# Patient Record
Sex: Female | Born: 1955 | Race: White | Hispanic: No | Marital: Married | Smoking: Never smoker
Health system: Southern US, Community
[De-identification: ages and names within clinical notes are randomized; demographics above are authoritative.]

## PROBLEM LIST (undated history)

## (undated) DIAGNOSIS — I10 Essential (primary) hypertension: Secondary | ICD-10-CM

## (undated) DIAGNOSIS — E039 Hypothyroidism, unspecified: Secondary | ICD-10-CM

## (undated) DIAGNOSIS — E0500 Thyrotoxicosis with diffuse goiter without thyrotoxic crisis or storm: Secondary | ICD-10-CM

## (undated) HISTORY — DX: Essential (primary) hypertension: I10

## (undated) HISTORY — DX: Hypothyroidism, unspecified: E03.9

## (undated) HISTORY — DX: Thyrotoxicosis with diffuse goiter without thyrotoxic crisis or storm: E05.00

---

## 2005-01-12 ENCOUNTER — Ambulatory Visit: Payer: Self-pay

## 2006-01-18 ENCOUNTER — Ambulatory Visit: Payer: Self-pay

## 2007-01-08 ENCOUNTER — Ambulatory Visit: Payer: Self-pay

## 2007-01-24 ENCOUNTER — Ambulatory Visit: Payer: Self-pay

## 2008-02-03 ENCOUNTER — Ambulatory Visit: Payer: Self-pay

## 2009-02-18 ENCOUNTER — Ambulatory Visit: Payer: Self-pay

## 2010-02-24 ENCOUNTER — Ambulatory Visit: Payer: Self-pay

## 2010-03-27 ENCOUNTER — Ambulatory Visit: Payer: Self-pay | Admitting: Internal Medicine

## 2010-11-12 ENCOUNTER — Ambulatory Visit: Payer: Self-pay | Admitting: Internal Medicine

## 2011-03-02 ENCOUNTER — Ambulatory Visit: Payer: Self-pay

## 2011-03-09 ENCOUNTER — Ambulatory Visit: Payer: Self-pay

## 2011-03-18 ENCOUNTER — Ambulatory Visit: Payer: Self-pay | Admitting: Internal Medicine

## 2012-01-18 ENCOUNTER — Ambulatory Visit: Payer: Self-pay | Admitting: Family Medicine

## 2012-03-20 ENCOUNTER — Ambulatory Visit: Payer: Self-pay

## 2013-03-25 ENCOUNTER — Ambulatory Visit: Payer: Self-pay

## 2013-04-08 LAB — HM COLONOSCOPY

## 2014-01-14 LAB — LIPID PANEL
Cholesterol: 161 mg/dL (ref 0–200)
HDL: 34 mg/dL — AB (ref 35–70)
LDL Cholesterol: 84 mg/dL
LDl/HDL Ratio: 2.5
Triglycerides: 215 mg/dL — AB (ref 40–160)

## 2014-01-14 LAB — HEPATIC FUNCTION PANEL
ALT: 22 U/L (ref 7–35)
AST: 23 U/L (ref 13–35)
Alkaline Phosphatase: 94 U/L (ref 25–125)
Bilirubin, Total: 0.3 mg/dL

## 2014-01-14 LAB — CBC AND DIFFERENTIAL
HCT: 41 % (ref 36–46)
Hemoglobin: 14.2 g/dL (ref 12.0–16.0)
Neutrophils Absolute: 50 /uL
Platelets: 284 10*3/uL (ref 150–399)

## 2014-01-14 LAB — BASIC METABOLIC PANEL
BUN: 9 mg/dL (ref 4–21)
Creatinine: 0.7 mg/dL (ref <=1.1)
Glucose: 93 mg/dL
Potassium: 4.5 mmol/L (ref 3.4–5.3)
Sodium: 138 mmol/L (ref 137–147)

## 2014-01-14 LAB — TSH: TSH: 0.51 u[IU]/mL (ref <=5.90)

## 2014-01-17 LAB — CBC AND DIFFERENTIAL: WBC: 5.6 10^3/mL

## 2014-03-30 ENCOUNTER — Ambulatory Visit: Payer: Self-pay

## 2014-11-08 DIAGNOSIS — F329 Major depressive disorder, single episode, unspecified: Secondary | ICD-10-CM | POA: Insufficient documentation

## 2014-11-08 DIAGNOSIS — E039 Hypothyroidism, unspecified: Secondary | ICD-10-CM | POA: Insufficient documentation

## 2014-11-08 DIAGNOSIS — E0500 Thyrotoxicosis with diffuse goiter without thyrotoxic crisis or storm: Secondary | ICD-10-CM | POA: Insufficient documentation

## 2015-01-15 ENCOUNTER — Other Ambulatory Visit: Payer: Self-pay | Admitting: Family Medicine

## 2015-01-26 ENCOUNTER — Encounter: Payer: Self-pay | Admitting: Family Medicine

## 2015-01-26 ENCOUNTER — Ambulatory Visit (INDEPENDENT_AMBULATORY_CARE_PROVIDER_SITE_OTHER): Payer: BC Managed Care – PPO | Admitting: Family Medicine

## 2015-01-26 VITALS — BP 120/78 | HR 64 | Temp 97.9°F | Resp 16 | Ht 61.0 in | Wt 126.0 lb

## 2015-01-26 DIAGNOSIS — M7660 Achilles tendinitis, unspecified leg: Secondary | ICD-10-CM | POA: Diagnosis not present

## 2015-01-26 DIAGNOSIS — Z0000 Encounter for general adult medical examination without abnormal findings: Secondary | ICD-10-CM | POA: Diagnosis not present

## 2015-01-26 LAB — POCT URINALYSIS DIPSTICK
Bilirubin, UA: NEGATIVE
Blood, UA: NEGATIVE
Glucose, UA: NEGATIVE
Ketones, UA: NEGATIVE
Leukocytes, UA: NEGATIVE
Nitrite, UA: NEGATIVE
Protein, UA: NEGATIVE
Spec Grav, UA: 1.025
Urobilinogen, UA: 0.2
pH, UA: 6

## 2015-01-26 NOTE — Progress Notes (Signed)
Patient ID: Tina Marshall, female   DOB: 03/18/1956, 59 y.o.   MRN: [id number]       Patient: Tina Marshall, Female    DOB: 1955/12/13, 59 y.o.   MRN: [id number] Visit Date: 01/26/2015  Today's Provider: Megan Mans, MD   Chief Complaint  Patient presents with  . Annual Exam   Subjective:    Annual physical exam Tina Marshall is a 59 y.o. female who presents today for health maintenance and complete physical. She feels well. She reports exercising every day. She reports she is sleeping well.  ----------------------------------------------------------------- Pap and mammo with Dr. Luella Cook Colonscopy per pt Dr. Servando Snare but they dont have records EKG- 01/04/12 Tdap 01/04/12 BMD- 01/18/12 Normal Osteopenia left dual femur Zoster 12/07/10   Review of Systems  Constitutional: Negative.   HENT: Negative.   Eyes: Negative.   Respiratory: Negative.   Cardiovascular: Negative.   Gastrointestinal: Negative.   Endocrine: Negative.   Genitourinary: Negative.   Musculoskeletal:       She noticed pain at the heel of the right foot after going to the beach and walking in the sand for a week. Some mornings it is worse. Bothers her when she wears sandals or flip-flops or goes barefoot.  Skin: Negative.   Allergic/Immunologic: Negative.   Neurological: Negative.   Hematological: Negative.   Psychiatric/Behavioral: Negative.   All other systems reviewed and are negative.   Social History She  reports that she has never smoked. She has never used smokeless tobacco. She reports that she does not drink alcohol or use illicit drugs.  Patient Active Problem List   Diagnosis Date Noted  . 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 Affective disorder, major 11/08/2014    History reviewed. No pertinent past surgical history.  Family History  Family Status  Relation Status Death Age  . Mother Alive   . Father Alive   . Sister Alive   . Brother Alive   .  Daughter Alive   . Maternal Grandmother Deceased   . Maternal Grandfather Deceased   . Paternal Grandmother Deceased   . Paternal Grandfather Deceased     Her family history includes Anemia in her sister; Atrial fibrillation in her mother; Breast cancer in her maternal grandmother and paternal grandmother; Dementia in her father; Heart attack in her maternal grandfather and paternal grandfather; Heart disease in her father; Hypertension in her brother and sister; Parkinson's disease in her father.    No Known Allergies  Previous Medications   ASPIRIN 81 MG EC TABLET    Take by mouth.   CRANBERRY FRUIT 475 MG CAPS    Take by mouth.   LEVOTHYROXINE (SYNTHROID, LEVOTHROID) 88 MCG TABLET    TAKE ONE TABLET BY MOUTH ONCE DAILY   MULTIPLE VITAMIN PO    Take by mouth.   PROBIOTIC PRODUCT (PROBIOTIC ADVANCED PO)    Take by mouth.    Patient Care Team: Maple Hudson., MD as PCP - General (Family Medicine)     Objective:   Vitals: BP 120/78 mmHg  Pulse 64  Temp(Src) 97.9 F (36.6 C) (Oral)  Resp 16  Ht 5\' 1"  (1.549 m)  Wt 126 lb (57.153 kg)  BMI 23.82 kg/m2   Physical Exam  Constitutional: She is oriented to person, place, and time. She appears well-developed and well-nourished.  HENT:  Head: Normocephalic and atraumatic.  Right Ear: External ear normal.  Left Ear: External ear normal.  Nose: Nose normal.  Mouth/Throat: Oropharynx is clear and moist.  Eyes: EOM are normal. Pupils are equal, round, and reactive to light.  Neck: Normal range of motion. Neck supple.  Cardiovascular: Normal rate, regular rhythm, normal heart sounds and intact distal pulses.   Pulmonary/Chest: Effort normal and breath sounds normal.  Abdominal: Soft. Bowel sounds are normal.  Genitourinary:  deferred to GYN  Musculoskeletal: Normal range of motion.  Neurological: She is alert and oriented to person, place, and time. She has normal reflexes.  Skin: Skin is warm and dry.  Deeply tanned but  no skin lesions noted.  Psychiatric: She has a normal mood and affect. Her behavior is normal. Judgment and thought content normal.     Depression Screen No flowsheet data found.    Assessment & Plan:     Routine Health Maintenance and Physical Exam  Exercise Activities and Dietary recommendations Goals    None      Immunization History  Administered Date(s) Administered  . Tdap 01/04/2012  . Zoster 12/07/2010    Health Maintenance  Topic Date Due  . Hepatitis C Screening  1955/10/10  . HIV Screening  07/16/1971  . PAP SMEAR  07/15/1974  . MAMMOGRAM  07/15/2006  . COLONOSCOPY  07/15/2006  . INFLUENZA VACCINE  01/11/2015  . TETANUS/TDAP  01/03/2022      Discussed health benefits of physical activity, and encouraged her to engage in regular exercise appropriate for her age and condition.    --------------------------------------------------------------------  1. Annual physical exam  - CBC with Differential/Platelet - COMPLETE METABOLIC PANEL WITH GFR - Lipid Panel With LDL/HDL Ratio - TSH - POCT urinalysis dipstick  2. Achilles tendinitis, right She will wear her tennis shoes through September. Call if no better. Also [place] Aleve once a  day for pain as needed. I have done the exam and reviewed the above chart and it is accurate to the best of my knowledge.  Patient was seen and examined by Dr. Julieanne Manson, and noted scribed by Dimas Chyle, CMA

## 2015-01-27 LAB — COMPREHENSIVE METABOLIC PANEL
ALT: 18 IU/L (ref 0–32)
AST: 20 IU/L (ref 0–40)
Albumin/Globulin Ratio: 2 (ref 1.1–2.5)
Albumin: 4.5 g/dL (ref 3.5–5.5)
Alkaline Phosphatase: 78 IU/L (ref 39–117)
BUN/Creatinine Ratio: 19 (ref 9–23)
BUN: 13 mg/dL (ref 6–24)
Bilirubin Total: 0.5 mg/dL (ref 0.0–1.2)
CO2: 23 mmol/L (ref 18–29)
Calcium: 9.4 mg/dL (ref 8.7–10.2)
Chloride: 105 mmol/L (ref 97–108)
Creatinine, Ser: 0.68 mg/dL (ref 0.57–1.00)
GFR calc Af Amer: 112 mL/min/{1.73_m2} (ref >59)
GFR calc non Af Amer: 97 mL/min/{1.73_m2} (ref >59)
Globulin, Total: 2.2 g/dL (ref 1.5–4.5)
Glucose: 89 mg/dL (ref 65–99)
Potassium: 4.5 mmol/L (ref 3.5–5.2)
Sodium: 144 mmol/L (ref 134–144)
Total Protein: 6.7 g/dL (ref 6.0–8.5)

## 2015-01-27 LAB — CBC WITH DIFFERENTIAL/PLATELET
Basophils Absolute: 0 10*3/uL (ref 0.0–0.2)
Basos: 1 %
EOS (ABSOLUTE): 0.2 10*3/uL (ref 0.0–0.4)
Eos: 5 %
Hematocrit: 38.2 % (ref 34.0–46.6)
Hemoglobin: 13.3 g/dL (ref 11.1–15.9)
Immature Grans (Abs): 0 10*3/uL (ref 0.0–0.1)
Immature Granulocytes: 0 %
Lymphocytes Absolute: 1.9 10*3/uL (ref 0.7–3.1)
Lymphs: 37 %
MCH: 33.4 pg — ABNORMAL HIGH (ref 26.6–33.0)
MCHC: 34.8 g/dL (ref 31.5–35.7)
MCV: 96 fL (ref 79–97)
Monocytes Absolute: 0.4 10*3/uL (ref 0.1–0.9)
Monocytes: 8 %
Neutrophils Absolute: 2.7 10*3/uL (ref 1.4–7.0)
Neutrophils: 49 %
Platelets: 251 10*3/uL (ref 150–379)
RBC: 3.98 x10E6/uL (ref 3.77–5.28)
RDW: 13.5 % (ref 12.3–15.4)
WBC: 5.3 10*3/uL (ref 3.4–10.8)

## 2015-01-27 LAB — TSH: TSH: 0.715 u[IU]/mL (ref 0.450–4.500)

## 2015-01-27 LAB — LIPID PANEL WITH LDL/HDL RATIO
Cholesterol, Total: 151 mg/dL (ref 100–199)
HDL: 39 mg/dL — ABNORMAL LOW (ref >39)
LDL Calculated: 84 mg/dL (ref 0–99)
LDl/HDL Ratio: 2.2 ratio units (ref 0.0–3.2)
Triglycerides: 142 mg/dL (ref 0–149)
VLDL Cholesterol Cal: 28 mg/dL (ref 5–40)

## 2015-01-28 ENCOUNTER — Other Ambulatory Visit: Payer: Self-pay | Admitting: Emergency Medicine

## 2015-01-28 DIAGNOSIS — E039 Hypothyroidism, unspecified: Secondary | ICD-10-CM

## 2015-01-28 MED ORDER — LEVOTHYROXINE SODIUM 88 MCG PO TABS: 88.0000 ug | ORAL_TABLET | Freq: Every day | ORAL | Status: DC

## 2015-02-02 LAB — HM PAP SMEAR

## 2015-03-09 ENCOUNTER — Other Ambulatory Visit: Payer: Self-pay | Admitting: Unknown Physician Specialty

## 2015-03-09 DIAGNOSIS — Z1231 Encounter for screening mammogram for malignant neoplasm of breast: Secondary | ICD-10-CM

## 2015-04-06 ENCOUNTER — Ambulatory Visit: Payer: Self-pay

## 2015-04-07 ENCOUNTER — Ambulatory Visit
Admission: RE | Admit: 2015-04-07 | Discharge: 2015-04-07 | Disposition: A | Discharge status: Home / Self Care | Payer: BC Managed Care – PPO | Source: Ambulatory Visit | Attending: Unknown Physician Specialty | Admitting: Unknown Physician Specialty

## 2015-04-07 DIAGNOSIS — Z1231 Encounter for screening mammogram for malignant neoplasm of breast: Secondary | ICD-10-CM | POA: Insufficient documentation

## 2015-04-14 ENCOUNTER — Other Ambulatory Visit: Payer: Self-pay | Admitting: Family Medicine

## 2015-04-14 DIAGNOSIS — E039 Hypothyroidism, unspecified: Secondary | ICD-10-CM

## 2015-04-14 MED ORDER — LEVOTHYROXINE SODIUM 88 MCG PO TABS: 88.0000 ug | ORAL_TABLET | Freq: Every day | ORAL | Status: DC

## 2015-04-14 NOTE — Telephone Encounter (Signed)
Yes, done-aa

## 2015-04-14 NOTE — Telephone Encounter (Signed)
Last TSH was checked on 01/26/15 and was stable. Ok to refill medication? Please advise. Thanks!

## 2015-04-14 NOTE — Telephone Encounter (Signed)
Pt needs 90 d refill on levothyroxine (SYNTHROID, LEVOTHROID) 88 MCG tablet 01/28/15 --   [facility] [address]  Call back is [phone]  Thanks Barth Kirkseri  -

## 2015-04-20 ENCOUNTER — Telehealth: Payer: Self-pay | Admitting: Family Medicine

## 2015-04-20 ENCOUNTER — Other Ambulatory Visit: Payer: Self-pay

## 2015-04-20 DIAGNOSIS — E039 Hypothyroidism, unspecified: Secondary | ICD-10-CM

## 2015-04-20 MED ORDER — LEVOTHYROXINE SODIUM 88 MCG PO TABS: 88.0000 ug | ORAL_TABLET | Freq: Every day | ORAL | Status: DC

## 2015-04-20 NOTE — Telephone Encounter (Signed)
Pt contacted office for refill request on the following medications:  Pt is requesting the Rx for levothyroxine (SYNTHROID, LEVOTHROID) 88 MCG resent for a 90 day supply.  [facility] [address]  CB#[phone]/MW

## 2016-01-18 NOTE — Telephone Encounter (Signed)
error 

## 2016-01-27 ENCOUNTER — Encounter: Payer: Self-pay | Admitting: Family Medicine

## 2016-01-27 ENCOUNTER — Ambulatory Visit (INDEPENDENT_AMBULATORY_CARE_PROVIDER_SITE_OTHER): Payer: BC Managed Care – PPO | Admitting: Family Medicine

## 2016-01-27 VITALS — BP 124/70 | HR 76 | Temp 97.8°F | Resp 14 | Ht 61.0 in | Wt 127.0 lb

## 2016-01-27 DIAGNOSIS — E039 Hypothyroidism, unspecified: Secondary | ICD-10-CM | POA: Diagnosis not present

## 2016-01-27 DIAGNOSIS — Z1283 Encounter for screening for malignant neoplasm of skin: Secondary | ICD-10-CM | POA: Diagnosis not present

## 2016-01-27 DIAGNOSIS — Z0000 Encounter for general adult medical examination without abnormal findings: Secondary | ICD-10-CM | POA: Diagnosis not present

## 2016-01-27 LAB — POCT URINALYSIS DIPSTICK
Bilirubin, UA: NEGATIVE
Blood, UA: NEGATIVE
Glucose, UA: NEGATIVE
Ketones, UA: NEGATIVE
Leukocytes, UA: NEGATIVE
Nitrite, UA: NEGATIVE
Protein, UA: NEGATIVE
Spec Grav, UA: 1.01
Urobilinogen, UA: 0.2
pH, UA: 6.5

## 2016-01-27 MED ORDER — LEVOTHYROXINE SODIUM 88 MCG PO TABS: 88.0000 ug | ORAL_TABLET | Freq: Every day | ORAL | 3 refills | Status: DC

## 2016-01-27 MED ORDER — LEVOTHYROXINE SODIUM 88 MCG PO TABS: 88.0000 ug | ORAL_TABLET | Freq: Every day | ORAL | 12 refills | Status: DC

## 2016-01-27 NOTE — Progress Notes (Signed)
Patient: Tina AltLaurie K Sumida, Female    DOB: 11/22/1955, 60 y.o.   MRN: [id number] Visit Date: 01/27/2016  Today's Provider: Megan Mansichard Gilbert Jr, MD   Chief Complaint  Patient presents with  . Annual Exam   Subjective:    Annual physical exam Tina Marshall is a 60 y.o. female who presents today for health maintenance and complete physical. She feels well. She reports exercising 4-5 times a week. She reports she is sleeping well. 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 She feels great. ----------------------------------------------------------------- Colonoscopy-03/12/13 normal repeat 10 years. Pap- 01/2015- Has an appointment next week for this with [facility].  Mammo- 04/07/15 Normal- Dr. Luella Cookosenow  Immunization History  Administered Date(s) Administered  . Influenza,inj,Quad PF,36+ Mos 03/12/2015  . Tdap 01/04/2012  . Zoster 12/07/2010     Review of Systems  Constitutional: Negative.   HENT: Negative.   Eyes: Negative.   Respiratory: Negative.   Cardiovascular: Negative.   Gastrointestinal: Negative.   Endocrine: Negative.   Genitourinary: Negative.   Musculoskeletal: Negative.   Skin: Negative.   Allergic/Immunologic: Negative.   Neurological: Negative.   Hematological: Negative.   Psychiatric/Behavioral: Negative.     Social History      She  reports that she has never smoked. She has never used smokeless tobacco. She reports that she does not drink alcohol or use drugs.       Social History   Social History  . Marital status: Married    Spouse name: N/A  . Number of children: N/A  . Years of education: N/A   Social History Main Topics  . Smoking status: Never Smoker  . Smokeless tobacco: Never Used  . Alcohol use No  . Drug use: No  . Sexual activity: Not Asked   Other Topics Concern  . None   Social History Narrative  . None    History reviewed. No pertinent past medical  history.   Patient Active Problem List   Diagnosis Date Noted  . 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 Affective disorder, major (HCC) 11/08/2014    History reviewed. No pertinent surgical history.  Family History        Family Status  Relation Status  . Mother Alive  . Father Alive  . Sister Alive  . Brother Alive  . Maternal Grandmother Deceased  . Maternal Grandfather Deceased  . Paternal Grandmother Deceased  . Paternal Grandfather Deceased  . Daughter Alive  . Paternal Aunt         Her family history includes Anemia in her sister; Atrial fibrillation in her mother; Breast cancer in her paternal grandmother; Breast cancer (age of onset: 6738) in her paternal aunt; Breast cancer (age of onset: 4270) in her maternal grandmother; Dementia in her father; Heart attack in her maternal grandfather and paternal grandfather; Heart disease in her father; Hypertension in her brother and sister; Parkinson's disease in her father.    No Known Allergies  Current Meds  Medication Sig  . aspirin 81 MG EC tablet Take by mouth.  . Cranberry Fruit 475 MG CAPS Take by mouth.  . levothyroxine (SYNTHROID, LEVOTHROID) 88 MCG tablet Take 1 tablet (88 mcg total) by mouth daily.  . MULTIPLE VITAMIN PO Take by mouth.  . Probiotic Product (PROBIOTIC ADVANCED PO) Take by mouth.    Patient Care Team: Maple Hudsonichard L Gilbert Jr., MD as PCP - General (Family Medicine)  Objective:   Vitals: BP 124/70 (BP Location: Left Arm, Patient Position: Sitting, Cuff Size: Normal)   Pulse 76   Temp 97.8 F (36.6 C) (Oral)   Resp 14   Ht 5\' 1"  (1.549 m)   Wt 127 lb (57.6 kg)   BMI 24.00 kg/m    Physical Exam  Constitutional: She is oriented to person, place, and time. She appears well-developed and well-nourished.  HENT:  Head: Normocephalic and atraumatic.  Right Ear: External ear normal.  Left Ear: External ear normal.  Nose: Nose normal.  Mouth/Throat: Oropharynx is clear  and moist.  Eyes: Conjunctivae and EOM are normal. Pupils are equal, round, and reactive to light.  Neck: Normal range of motion. Neck supple.  Cardiovascular: Normal rate, regular rhythm, normal heart sounds and intact distal pulses.   Pulmonary/Chest: Effort normal and breath sounds normal.  Abdominal: Soft. Bowel sounds are normal.  Genitourinary:  Genitourinary Comments: GYN exam deferred to gynecology.  Musculoskeletal: Normal range of motion.  Neurological: She is alert and oriented to person, place, and time. She has normal reflexes.  Skin: Skin is warm and dry.  Patient has very tanned skin but no obvious lesions.  Psychiatric: She has a normal mood and affect. Her behavior is normal. Judgment and thought content normal.     Depression Screen PHQ 2/9 Scores 01/27/2016  PHQ - 2 Score 0      Assessment & Plan:     Routine Health Maintenance and Physical Exam  Exercise Activities and Dietary recommendations Goals    None      Immunization History  Administered Date(s) Administered  . Influenza,inj,Quad PF,36+ Mos 03/12/2015  . Tdap 01/04/2012  . Zoster 12/07/2010    Health Maintenance  Topic Date Due  . Hepatitis C Screening  1955/11/01  . HIV Screening  07/16/1971  . PAP SMEAR  07/15/1977  . COLONOSCOPY  07/15/2006  . INFLUENZA VACCINE  01/11/2016  . MAMMOGRAM  04/06/2017  . TETANUS/TDAP  01/03/2022      Discussed health benefits of physical activity, and encouraged her to engage in regular exercise appropriate for her age and condition.    --------------------------------------------------------------------  1. Annual physical exam Overall health is good. Gynecology exam per her OB/GYN next week. - CBC with Differential/Platelet - Lipid Panel With LDL/HDL Ratio - TSH - POCT urinalysis dipstick - Comprehensive metabolic panel - Ambulatory referral to Dermatology She is artery had shingles vaccine. 2. Skin cancer screening  - Ambulatory referral  to Dermatology  3. Hypothyroidism, unspecified hypothyroidism type  - TSH - levothyroxine (SYNTHROID, LEVOTHROID) 88 MCG tablet; Take 1 tablet (88 mcg total) by mouth daily.  Dispense: 30 tablet; Refill: 12 4. Mild dyshidrotic eczema of both hands Refer to dermatology.  Richard Wendelyn BreslowGilbert Jr, MD  [facility]

## 2016-01-28 LAB — CBC WITH DIFFERENTIAL/PLATELET
Basophils Absolute: 0.1 10*3/uL (ref 0.0–0.2)
Basos: 1 %
EOS (ABSOLUTE): 0.3 10*3/uL (ref 0.0–0.4)
Eos: 6 %
Hematocrit: 39 % (ref 34.0–46.6)
Hemoglobin: 13.4 g/dL (ref 11.1–15.9)
Immature Grans (Abs): 0 10*3/uL (ref 0.0–0.1)
Immature Granulocytes: 0 %
Lymphocytes Absolute: 1.8 10*3/uL (ref 0.7–3.1)
Lymphs: 39 %
MCH: 33.2 pg — ABNORMAL HIGH (ref 26.6–33.0)
MCHC: 34.4 g/dL (ref 31.5–35.7)
MCV: 97 fL (ref 79–97)
Monocytes Absolute: 0.4 10*3/uL (ref 0.1–0.9)
Monocytes: 8 %
Neutrophils Absolute: 2.2 10*3/uL (ref 1.4–7.0)
Neutrophils: 46 %
Platelets: 251 10*3/uL (ref 150–379)
RBC: 4.04 x10E6/uL (ref 3.77–5.28)
RDW: 13.8 % (ref 12.3–15.4)
WBC: 4.7 10*3/uL (ref 3.4–10.8)

## 2016-01-28 LAB — COMPREHENSIVE METABOLIC PANEL
ALT: 37 IU/L — ABNORMAL HIGH (ref 0–32)
AST: 31 IU/L (ref 0–40)
Albumin/Globulin Ratio: 1.8 (ref 1.2–2.2)
Albumin: 4.5 g/dL (ref 3.5–5.5)
Alkaline Phosphatase: 74 IU/L (ref 39–117)
BUN/Creatinine Ratio: 23 (ref 9–23)
BUN: 13 mg/dL (ref 6–24)
Bilirubin Total: 0.4 mg/dL (ref 0.0–1.2)
CO2: 23 mmol/L (ref 18–29)
Calcium: 9.3 mg/dL (ref 8.7–10.2)
Chloride: 103 mmol/L (ref 96–106)
Creatinine, Ser: 0.56 mg/dL — ABNORMAL LOW (ref 0.57–1.00)
GFR calc Af Amer: 118 mL/min/{1.73_m2} (ref >59)
GFR calc non Af Amer: 102 mL/min/{1.73_m2} (ref >59)
Globulin, Total: 2.5 g/dL (ref 1.5–4.5)
Glucose: 98 mg/dL (ref 65–99)
Potassium: 4.5 mmol/L (ref 3.5–5.2)
Sodium: 141 mmol/L (ref 134–144)
Total Protein: 7 g/dL (ref 6.0–8.5)

## 2016-01-28 LAB — LIPID PANEL WITH LDL/HDL RATIO
Cholesterol, Total: 167 mg/dL (ref 100–199)
HDL: 48 mg/dL (ref >39)
LDL Calculated: 103 mg/dL — ABNORMAL HIGH (ref 0–99)
LDl/HDL Ratio: 2.1 ratio units (ref 0.0–3.2)
Triglycerides: 79 mg/dL (ref 0–149)
VLDL Cholesterol Cal: 16 mg/dL (ref 5–40)

## 2016-01-28 LAB — TSH: TSH: 1.31 u[IU]/mL (ref 0.450–4.500)

## 2016-04-10 ENCOUNTER — Other Ambulatory Visit: Payer: Self-pay | Admitting: Family Medicine

## 2016-04-10 ENCOUNTER — Other Ambulatory Visit: Payer: Self-pay | Admitting: Obstetrics and Gynecology

## 2016-04-10 DIAGNOSIS — Z1231 Encounter for screening mammogram for malignant neoplasm of breast: Secondary | ICD-10-CM

## 2016-05-17 ENCOUNTER — Ambulatory Visit
Admission: RE | Admit: 2016-05-17 | Discharge: 2016-05-17 | Disposition: A | Discharge status: Home / Self Care | Payer: BC Managed Care – PPO | Source: Ambulatory Visit | Attending: Obstetrics and Gynecology | Admitting: Obstetrics and Gynecology

## 2016-05-17 DIAGNOSIS — Z1231 Encounter for screening mammogram for malignant neoplasm of breast: Secondary | ICD-10-CM | POA: Diagnosis not present

## 2017-01-24 ENCOUNTER — Other Ambulatory Visit: Payer: Self-pay | Admitting: Family Medicine

## 2017-01-24 DIAGNOSIS — E039 Hypothyroidism, unspecified: Secondary | ICD-10-CM

## 2017-01-30 ENCOUNTER — Encounter: Payer: Self-pay | Admitting: Family Medicine

## 2017-01-30 ENCOUNTER — Ambulatory Visit (INDEPENDENT_AMBULATORY_CARE_PROVIDER_SITE_OTHER): Payer: BC Managed Care – PPO | Admitting: Family Medicine

## 2017-01-30 VITALS — BP 122/70 | HR 72 | Temp 97.8°F | Resp 16 | Ht 61.0 in | Wt 131.0 lb

## 2017-01-30 DIAGNOSIS — Z780 Asymptomatic menopausal state: Secondary | ICD-10-CM

## 2017-01-30 DIAGNOSIS — M85859 Other specified disorders of bone density and structure, unspecified thigh: Secondary | ICD-10-CM | POA: Diagnosis not present

## 2017-01-30 DIAGNOSIS — Z0000 Encounter for general adult medical examination without abnormal findings: Secondary | ICD-10-CM

## 2017-01-30 LAB — POCT URINALYSIS DIPSTICK
Bilirubin, UA: NEGATIVE
Blood, UA: NEGATIVE
Glucose, UA: NEGATIVE
Ketones, UA: NEGATIVE
Leukocytes, UA: NEGATIVE
Nitrite, UA: NEGATIVE
Protein, UA: NEGATIVE
Spec Grav, UA: 1.015 (ref 1.010–1.025)
Urobilinogen, UA: 0.2 E.U./dL
pH, UA: 6.5 (ref 5.0–8.0)

## 2017-01-30 NOTE — Progress Notes (Signed)
Patient: Tina Marshall, Female    DOB: 05-Nov-1955, 61 y.o.   MRN: [id number] Visit Date: 01/30/2017  Today's Provider: Megan Mans, MD   Chief Complaint  Patient presents with  . Annual Exam   Subjective:  Tina Marshall is a 61 y.o. female who presents today for health maintenance and complete physical. She feels well. She reports exercising 4 times week. She reports she is sleeping well.  Immunization History  Administered Date(s) Administered  . Influenza,inj,Quad PF,6+ Mos 03/12/2015  . Influenza,inj,quad, With Preservative 03/24/2016  . Tdap 01/04/2012  . Zoster 12/07/2010   03/12/13 Colonoscopy-normal 01/18/12 BMD-Osteopenia 05/18/16 Mammogram-normal   Review of Systems  Constitutional: Negative.   HENT: Negative.   Eyes: Negative.   Respiratory: Negative.   Cardiovascular: Negative.   Gastrointestinal: Negative.   Endocrine: Negative.   Genitourinary: Negative.   Musculoskeletal: Negative.   Skin: Negative.   Allergic/Immunologic: Negative.   Neurological: Negative.   Hematological: Negative.   Psychiatric/Behavioral: Negative.     Social History   Social History  . Marital status: Married    Spouse name: N/A  . Number of children: N/A  . Years of education: N/A   Occupational History  . Not on file.   Social History Main Topics  . Smoking status: Never Smoker  . Smokeless tobacco: Never Used  . Alcohol use No  . Drug use: No  . Sexual activity: Not on file   Other Topics Concern  . Not on file   Social History Narrative  . No narrative on file    Patient Active Problem List   Diagnosis Date Noted  . 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 Affective disorder, major 11/08/2014    History reviewed. No pertinent surgical history.  Her family history includes Anemia in her sister; Atrial fibrillation in her mother; Breast cancer in her paternal grandmother; Breast cancer (age of onset: 83) in her paternal aunt;  Breast cancer (age of onset: 65) in her maternal grandmother; Dementia in her father; Heart attack in her maternal grandfather and paternal grandfather; Heart disease in her father; Hypertension in her brother and sister; Parkinson's disease in her father.     Outpatient Encounter Prescriptions as of 01/30/2017  Medication Sig Note  . aspirin 81 MG EC tablet Take by mouth. 11/08/2014: Received from: [facility]  . Cranberry Fruit 475 MG CAPS Take by mouth. 11/08/2014: Received from: [facility]  . levothyroxine (SYNTHROID, LEVOTHROID) 88 MCG tablet TAKE ONE TABLET BY MOUTH ONCE DAILY   . MULTIPLE VITAMIN PO Take by mouth. 11/08/2014: Received from: [facility]  . Probiotic Product (PROBIOTIC ADVANCED PO) Take by mouth.    No facility-administered encounter medications on file as of 01/30/2017.     Patient Care Team: Maple Hudson., MD as PCP - General (Family Medicine)      Objective:   Vitals:  Vitals:   01/30/17 1005  BP: 122/70  Pulse: 72  Resp: 16  Temp: 97.8 F (36.6 C)  TempSrc: Oral  Weight: 131 lb (59.4 kg)  Height: 5\' 1"  (1.549 m)    Physical Exam  Constitutional: She is oriented to person, place, and time. She appears well-developed and well-nourished.  HENT:  Head: Normocephalic and atraumatic.  Right Ear: External ear normal.  Left Ear: External ear normal.  Nose: Nose normal.  Mouth/Throat: Oropharynx is clear and moist.  Eyes: Pupils are equal, round, and reactive to light. Conjunctivae and EOM are normal.  Neck: Normal range of motion. Neck supple.  Cardiovascular: Normal rate, regular rhythm, normal heart sounds and intact distal pulses.   Pulmonary/Chest: Effort normal and breath sounds normal.  Abdominal: Soft. Bowel sounds are normal.  Musculoskeletal: Normal range of motion.  Neurological: She is alert and oriented to person, place, and time.  Skin: Skin is warm and dry.  Psychiatric: She has  a normal mood and affect. Her behavior is normal. Judgment and thought content normal.   Current Exercise Habits: Home exercise routine, Time (Minutes): 60, Frequency (Times/Week): 4, Weekly Exercise (Minutes/Week): 240    Fall Risk  01/30/2017  Falls in the past year? No   Functional Status Survey: Is the patient deaf or have difficulty hearing?: No Does the patient have difficulty seeing, even when wearing glasses/contacts?: No Does the patient have difficulty concentrating, remembering, or making decisions?: No Does the patient have difficulty walking or climbing stairs?: No Does the patient have difficulty dressing or bathing?: No Does the patient have difficulty doing errands alone such as visiting a doctor's office or shopping?: No   Depression Screen PHQ 2/9 Scores 01/30/2017 01/27/2016  PHQ - 2 Score 0 0  PHQ- 9 Score 0 -     Assessment & Plan:     Routine Health Maintenance and Physical Exam  Exercise Activities and Dietary recommendations Goals    None      Immunization History  Administered Date(s) Administered  . Influenza,inj,Quad PF,6+ Mos 03/12/2015  . Influenza,inj,quad, With Preservative 03/24/2016  . Tdap 01/04/2012  . Zoster 12/07/2010    Health Maintenance  Topic Date Due  . Hepatitis C Screening  1956/06/06  . HIV Screening  07/16/1971  . PAP SMEAR  07/15/1977  . COLONOSCOPY  07/15/2006  . INFLUENZA VACCINE  01/10/2017  . MAMMOGRAM  05/17/2018  . TETANUS/TDAP  01/03/2022   1. Annual physical exam  - CBC with Differential/Platelet - Comprehensive metabolic panel - Lipid Panel With LDL/HDL Ratio - TSH - POCT urinalysis dipstick  2. Osteopenia of neck of femur, unspecified laterality  - DG Bone Density; Future  3. Post-menopausal  - DG Bone Density; Future 4.Hypothyroid  Discussed health benefits of physical activity, and encouraged her to engage in regular exercise appropriate for her age and condition.    HPI, Exam and A&P  Transcribed under the direction and in the presence of Julieanne Manson, Montez Hageman., MD. Electronically Signed: Janey Greaser, RMA I have done the exam and reviewed the chart and it is accurate to the best of my knowledge. Dentist has been used and  any errors in dictation or transcription are unintentional. Julieanne Manson M.D. [facility]

## 2017-01-31 LAB — CBC WITH DIFFERENTIAL/PLATELET
Basophils Absolute: 0.1 10*3/uL (ref 0.0–0.2)
Basos: 1 %
EOS (ABSOLUTE): 0.3 10*3/uL (ref 0.0–0.4)
Eos: 5 %
Hematocrit: 40.3 % (ref 34.0–46.6)
Hemoglobin: 14 g/dL (ref 11.1–15.9)
Immature Grans (Abs): 0 10*3/uL (ref 0.0–0.1)
Immature Granulocytes: 0 %
Lymphocytes Absolute: 2.2 10*3/uL (ref 0.7–3.1)
Lymphs: 40 %
MCH: 33.5 pg — ABNORMAL HIGH (ref 26.6–33.0)
MCHC: 34.7 g/dL (ref 31.5–35.7)
MCV: 96 fL (ref 79–97)
Monocytes Absolute: 0.3 10*3/uL (ref 0.1–0.9)
Monocytes: 6 %
Neutrophils Absolute: 2.6 10*3/uL (ref 1.4–7.0)
Neutrophils: 48 %
Platelets: 249 10*3/uL (ref 150–379)
RBC: 4.18 x10E6/uL (ref 3.77–5.28)
RDW: 13.8 % (ref 12.3–15.4)
WBC: 5.4 10*3/uL (ref 3.4–10.8)

## 2017-01-31 LAB — COMPREHENSIVE METABOLIC PANEL
ALT: 36 IU/L — ABNORMAL HIGH (ref 0–32)
AST: 31 IU/L (ref 0–40)
Albumin/Globulin Ratio: 1.9 (ref 1.2–2.2)
Albumin: 4.6 g/dL (ref 3.6–4.8)
Alkaline Phosphatase: 76 IU/L (ref 39–117)
BUN/Creatinine Ratio: 16 (ref 12–28)
BUN: 11 mg/dL (ref 8–27)
Bilirubin Total: 0.4 mg/dL (ref 0.0–1.2)
CO2: 23 mmol/L (ref 20–29)
Calcium: 9.5 mg/dL (ref 8.7–10.3)
Chloride: 106 mmol/L (ref 96–106)
Creatinine, Ser: 0.69 mg/dL (ref 0.57–1.00)
GFR calc Af Amer: 109 mL/min/{1.73_m2} (ref >59)
GFR calc non Af Amer: 95 mL/min/{1.73_m2} (ref >59)
Globulin, Total: 2.4 g/dL (ref 1.5–4.5)
Glucose: 91 mg/dL (ref 65–99)
Potassium: 4.5 mmol/L (ref 3.5–5.2)
Sodium: 145 mmol/L — ABNORMAL HIGH (ref 134–144)
Total Protein: 7 g/dL (ref 6.0–8.5)

## 2017-01-31 LAB — LIPID PANEL WITH LDL/HDL RATIO
Cholesterol, Total: 155 mg/dL (ref 100–199)
HDL: 44 mg/dL (ref >39)
LDL Calculated: 84 mg/dL (ref 0–99)
LDl/HDL Ratio: 1.9 ratio (ref 0.0–3.2)
Triglycerides: 135 mg/dL (ref 0–149)
VLDL Cholesterol Cal: 27 mg/dL (ref 5–40)

## 2017-01-31 LAB — TSH: TSH: 1.16 u[IU]/mL (ref 0.450–4.500)

## 2017-02-07 ENCOUNTER — Encounter: Payer: Self-pay | Admitting: Obstetrics and Gynecology

## 2017-02-07 ENCOUNTER — Ambulatory Visit (INDEPENDENT_AMBULATORY_CARE_PROVIDER_SITE_OTHER): Payer: BC Managed Care – PPO | Admitting: Obstetrics and Gynecology

## 2017-02-07 VITALS — BP 126/78 | Ht 61.0 in | Wt 132.0 lb

## 2017-02-07 DIAGNOSIS — Z1231 Encounter for screening mammogram for malignant neoplasm of breast: Secondary | ICD-10-CM | POA: Diagnosis not present

## 2017-02-07 DIAGNOSIS — Z803 Family history of malignant neoplasm of breast: Secondary | ICD-10-CM

## 2017-02-07 DIAGNOSIS — N952 Postmenopausal atrophic vaginitis: Secondary | ICD-10-CM | POA: Diagnosis not present

## 2017-02-07 DIAGNOSIS — Z01419 Encounter for gynecological examination (general) (routine) without abnormal findings: Secondary | ICD-10-CM

## 2017-02-07 DIAGNOSIS — L298 Other pruritus: Secondary | ICD-10-CM | POA: Diagnosis not present

## 2017-02-07 DIAGNOSIS — Z1239 Encounter for other screening for malignant neoplasm of breast: Secondary | ICD-10-CM

## 2017-02-07 DIAGNOSIS — N898 Other specified noninflammatory disorders of vagina: Secondary | ICD-10-CM

## 2017-02-07 NOTE — Progress Notes (Signed)
PCP: Maple Hudson., MD   Chief Complaint  Patient presents with  . Annual Exam    HPI:      [name] is a 61 y.o. [id number] who LMP was No LMP recorded. Patient is postmenopausal., presents today for her annual examination.  Her menses are absent due to menopause. She does not have intermenstrual bleeding. She does not have vasomotor sx.  Sex activity: single partner, contraception - post menopausal status. She does not have vaginal dryness. She does have episodic ext vaginal itching, without increased d/c, odor. Sx intermittent, sometimes worse after exercise. She uses dove soap, bath and body works, no Scientist, physiological. She is trying to wear more cotton underwear. She can't help but scratch. No sx with sex. She has been seeing derm for a eczematous type rash on the palmar surface of bilat hands for the past yr. Vag sx may have started about the same time.   Last Pap: February 02, 2015  Results were: no abnormalities /neg HPV DNA.  Hx of STDs: none  Last mammogram: May 17, 2016  Results were: normal--routine follow-up in 12 months There is a FH of breast cancer in her MGM, sister, mat cousin, and pat aunt and pancreatic cancer in her PGM, genetic testing hasn't been done by pt. Pt states affected sister did some type of testing and her results were inconclusive (at [facility]). There is no FH of ovarian cancer. The patient does do self-breast exams.  Colonoscopy: colonoscopy 4 years ago without abnormalities. . Repeat due after 10 years.    Tobacco use: The patient denies current or previous tobacco use. Alcohol use: none Exercise: moderately active  She does get adequate calcium and Vitamin D in her diet.  Since her last annual GYN exam, she has not had any significant changes in her health history.   Labs with PCP.    Past Medical History:  Diagnosis Date  . Graves disease   . Hypothyroidism     History reviewed. No pertinent surgical history.  Family  History  Problem Relation Age of Onset  . Atrial fibrillation Mother   . Heart disease Father   . Parkinson's disease Father   . Dementia Father   . Anemia Sister   . Hypertension Sister   . Breast cancer Sister 39  . Hypertension Brother   . Breast cancer Maternal Grandmother 18  . Heart attack Maternal Grandfather   . Pancreatic cancer Paternal Grandmother   . Heart attack Paternal Grandfather   . Breast cancer Paternal Aunt 72  . Breast cancer Cousin 39    Social History   Social History  . Marital status: Married    Spouse name: N/A  . Number of children: N/A  . Years of education: N/A   Occupational History  . Not on file.   Social History Main Topics  . Smoking status: Never Smoker  . Smokeless tobacco: Never Used  . Alcohol use No  . Drug use: No  . Sexual activity: Yes    Birth control/ protection: Post-menopausal   Other Topics Concern  . Not on file   Social History Narrative  . No narrative on file    No outpatient prescriptions have been marked as taking for the 02/07/17 encounter (Office Visit) with Jonavon Trieu, Ilona Sorrel, PA-C.      ROS:  Review of Systems  Constitutional: Negative for fatigue, fever and unexpected weight change.  Respiratory: Negative for cough, shortness of breath and wheezing.  Cardiovascular: Negative for chest pain, palpitations and leg swelling.  Gastrointestinal: Negative for blood in stool, constipation, diarrhea, nausea and vomiting.  Endocrine: Negative for cold intolerance, heat intolerance and polyuria.  Genitourinary: Negative for dyspareunia, dysuria, flank pain, frequency, genital sores, hematuria, menstrual problem, pelvic pain, urgency, vaginal bleeding, vaginal discharge and vaginal pain.  Musculoskeletal: Negative for back pain, joint swelling and myalgias.  Skin: Positive for rash.  Neurological: Negative for dizziness, syncope, light-headedness, numbness and headaches.  Hematological: Negative for adenopathy.    Psychiatric/Behavioral: Negative for agitation, confusion, sleep disturbance and suicidal ideas. The patient is not nervous/anxious.      Objective: BP 126/78   Ht 5\' 1"  (1.549 m)   Wt 132 lb (59.9 kg)   BMI 24.94 kg/m    Physical Exam  Constitutional: She is oriented to person, place, and time. She appears well-developed and well-nourished.  Genitourinary: Vagina normal and uterus normal. There is no tenderness or lesion on the right labia. There is no tenderness or lesion on the left labia. No erythema or tenderness in the vagina. No vaginal discharge found. Right adnexum does not display mass and does not display tenderness. Left adnexum does not display mass and does not display tenderness. Cervix does not exhibit motion tenderness or polyp. Uterus is not enlarged or tender.  Genitourinary Comments: BILAT LABIA MINORA WITH PALE SKIN, BRUISING FROM TRAUMA NEAR CLITORIS; NO EVID OF LICHEN SCLEROSUS; POS VAGINAL ATROPHY  Neck: Normal range of motion. No thyromegaly present.  Cardiovascular: Normal rate, regular rhythm and normal heart sounds.   No murmur heard. Pulmonary/Chest: Effort normal and breath sounds normal. Right breast exhibits no mass, no nipple discharge, no skin change and no tenderness. Left breast exhibits no mass, no nipple discharge, no skin change and no tenderness.  Abdominal: Soft. There is no tenderness. There is no guarding.  Musculoskeletal: Normal range of motion.  Neurological: She is alert and oriented to person, place, and time. No cranial nerve deficit.  Psychiatric: She has a normal mood and affect. Her behavior is normal.  Vitals reviewed.   Assessment/Plan:  Encounter for annual routine gynecological examination  Screening for breast cancer - Pt will sched mammo. - Plan: MM SCREENING BREAST TOMO BILATERAL  Family history of breast cancer - My Risk testing discussed and pt declines today. She will consider.  - Plan: MM SCREENING BREAST TOMO  BILATERAL  Vaginal itching - Question related to vag atrophy vs other. Dove sens skin soap, stop scratch using cold compresses prn itch/hydrocortisone crm. F/u with derm-next appt in 3 wks.  Vaginal atrophy - May need ext vag ERT if sx persist after seeing derm. No vag dryness during sex.          GYN counsel mammography screening, menopause, adequate intake of calcium and vitamin D, diet and exercise    F/U  Return in about 1 year (around 02/07/2018).  Alanna Storti B. Salahuddin Arismendez, PA-C 02/07/2017 1:42 PM

## 2017-02-15 ENCOUNTER — Encounter: Payer: Self-pay | Admitting: Family Medicine

## 2017-03-03 ENCOUNTER — Encounter: Payer: Self-pay | Admitting: Emergency Medicine

## 2017-03-03 ENCOUNTER — Emergency Department: Payer: BC Managed Care – PPO

## 2017-03-03 ENCOUNTER — Emergency Department
Admission: EM | Admit: 2017-03-03 | Discharge: 2017-03-03 | Disposition: A | Discharge status: Home / Self Care | Payer: BC Managed Care – PPO | Attending: Emergency Medicine | Admitting: Emergency Medicine

## 2017-03-03 DIAGNOSIS — Z7982 Long term (current) use of aspirin: Secondary | ICD-10-CM | POA: Insufficient documentation

## 2017-03-03 DIAGNOSIS — Z79899 Other long term (current) drug therapy: Secondary | ICD-10-CM | POA: Diagnosis not present

## 2017-03-03 DIAGNOSIS — W1849XA Other slipping, tripping and stumbling without falling, initial encounter: Secondary | ICD-10-CM | POA: Diagnosis not present

## 2017-03-03 DIAGNOSIS — Y939 Activity, unspecified: Secondary | ICD-10-CM | POA: Diagnosis not present

## 2017-03-03 DIAGNOSIS — Y929 Unspecified place or not applicable: Secondary | ICD-10-CM | POA: Diagnosis not present

## 2017-03-03 DIAGNOSIS — S93401A Sprain of unspecified ligament of right ankle, initial encounter: Secondary | ICD-10-CM | POA: Diagnosis not present

## 2017-03-03 DIAGNOSIS — S93601A Unspecified sprain of right foot, initial encounter: Secondary | ICD-10-CM | POA: Diagnosis not present

## 2017-03-03 DIAGNOSIS — E039 Hypothyroidism, unspecified: Secondary | ICD-10-CM | POA: Insufficient documentation

## 2017-03-03 DIAGNOSIS — S99921A Unspecified injury of right foot, initial encounter: Secondary | ICD-10-CM | POA: Diagnosis present

## 2017-03-03 DIAGNOSIS — Y998 Other external cause status: Secondary | ICD-10-CM | POA: Insufficient documentation

## 2017-03-03 NOTE — Discharge Instructions (Signed)
Your exam and x-ray are consistent with a sprain to the foot and ankle. Fortunately, there is no evidence fracture or dislocation. Wear the splint and post-op shoe as needed for comfort. Follow-up with Dr. Alberteen Spindle for continued symptoms. Rest, ice and elevate the ankle/foot to reduce pain and swelling.

## 2017-03-03 NOTE — ED Triage Notes (Signed)
Patient states that she tripped coming down a ladder. Patient with pain and swelling to right foot and ankle.

## 2017-03-03 NOTE — ED Provider Notes (Signed)
[facility] Emergency Department Provider Note ____________________________________________  Time seen: 2230  I have reviewed the triage vital signs and the nursing notes.  HISTORY  Chief Complaint  Foot Pain and Ankle Pain  HPI Tina Marshall is a 61 y.o. female visits to the ED, accompanied by husband, for evaluation of pain to the right foot and ankle. The patient describes she tripped while coming down her at tick latter, at home. She admits to wearing a hair of slippery bottom sandals while going up and down the ladder. She denies any other injury at this time. She reports pain to the lateral aspect of the ankle and dorsolateral aspect of her foot.  Past Medical History:  Diagnosis Date  . Graves disease   . Hypothyroidism     Patient Active Problem List   Diagnosis Date Noted  . Family history of breast cancer 02/07/2017  . 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 Affective disorder, major 11/08/2014    Past Surgical History:  Procedure Laterality Date  . CESAREAN SECTION      Prior to Admission medications   Medication Sig Start Date End Date Taking? Authorizing Provider  aspirin 81 MG EC tablet Take by mouth. 07/27/10   [provider]  Cranberry Fruit 475 MG CAPS Take by mouth.    [provider]  levothyroxine (SYNTHROID, LEVOTHROID) 88 MCG tablet TAKE ONE TABLET BY MOUTH ONCE DAILY 01/24/17   Maple Hudson., MD  MULTIPLE VITAMIN PO Take by mouth. 07/27/10   [provider]  Probiotic Product (PROBIOTIC ADVANCED PO) Take by mouth.    [provider]    Allergies Patient has no known allergies.  Family History  Problem Relation Age of Onset  . Atrial fibrillation Mother   . Heart disease Father   . Parkinson's disease Father   . Dementia Father   . Anemia Sister   . Hypertension Sister   . Breast cancer Sister 47  . Hypertension Brother   . Breast cancer Maternal  Grandmother 75  . Heart attack Maternal Grandfather   . Pancreatic cancer Paternal Grandmother   . Heart attack Paternal Grandfather   . Breast cancer Paternal Aunt 44  . Breast cancer Cousin 24    Social History Social History  Substance Use Topics  . Smoking status: Never Smoker  . Smokeless tobacco: Never Used  . Alcohol use Yes     Comment: occ    Review of Systems  Constitutional: Negative for fever. Musculoskeletal: Negative for back pain. Right ankle and foot pain as above Skin: Negative for rash. Neurological: Negative for headaches, focal weakness or numbness. ____________________________________________  PHYSICAL EXAM:  VITAL SIGNS: ED Triage Vitals  Enc Vitals Group     BP 03/03/17 2147 (!) 151/72     Pulse Rate 03/03/17 2147 80     Resp 03/03/17 2147 18     Temp 03/03/17 2147 98.6 F (37 C)     Temp Source 03/03/17 2147 Oral     SpO2 03/03/17 2147 97 %     Weight 03/03/17 2148 130 lb (59 kg)     Height 03/03/17 2148  (1.549 m)     Head Circumference --      Peak Flow --      Pain Score 03/03/17 2147 6     Pain Loc --      Pain Edu? --      Excl. in GC? --  Constitutional: Alert and oriented. Well appearing and in no distress. Head: Normocephalic and atraumatic. Cardiovascular: Normal distal pulses. Respiratory: Normal respiratory effort. Musculoskeletal: right ankle and foot with moderate swelling noted to the lateral malleolus. Is also some dorsal swelling and early ecchymosis to the foot. Patient without any plantar surface tenderness on palpation. She does have normal ankle range of motion but pain is elicited with dorsiflexion. No calf or Achilles tenderness is appreciated.Nontender with normal range of motion in all extremities.  Neurologic:  Antalgic gait without ataxia. Normal speech and language. No gross focal neurologic deficits are appreciated. Skin:  Skin is warm, dry and intact. No rash  noted. ____________________________________________   RADIOLOGY  Right Ankle IMPRESSION: Negative.  Right Foot  IMPRESSION: Negative.  I, Antonis Lor, Charlesetta Ivory, personally viewed and evaluated these images (plain radiographs) as part of my medical decision making, as well as reviewing the written report by the radiologist. ____________________________________________  PROCEDURES  Ankle Velcro Stirrup Splint Post-op Shoe ____________________________________________  INITIAL IMPRESSION / ASSESSMENT AND PLAN / ED COURSE  Patient with the ED evaluation of acute right ankle and foot pain following a mechanical fall. Her exam is overall benign and her x-rays do not show any acute fracture or dislocation. She is placed in a Velcro stirrup splint and a postop shoe for comfort. She will dosed Tylenol for pain relief. She is referred to podiatry for ongoing symptoms. RICE instructions are provided. ____________________________________________  FINAL CLINICAL IMPRESSION(S) / ED DIAGNOSES  Final diagnoses:  Sprain of right ankle, unspecified ligament, initial encounter  Sprain of right foot, initial encounter      Lissa Hoard, PA-C 03/04/17 0014    Sharman Cheek, MD 03/04/17 1615

## 2017-03-07 ENCOUNTER — Other Ambulatory Visit: Payer: Self-pay | Admitting: Podiatry

## 2017-03-07 DIAGNOSIS — S92014A Nondisplaced fracture of body of right calcaneus, initial encounter for closed fracture: Secondary | ICD-10-CM

## 2017-03-12 ENCOUNTER — Ambulatory Visit
Admission: RE | Admit: 2017-03-12 | Discharge: 2017-03-12 | Disposition: A | Discharge status: Home / Self Care | Payer: BC Managed Care – PPO | Source: Ambulatory Visit | Attending: Podiatry | Admitting: Podiatry

## 2017-03-12 DIAGNOSIS — S92014A Nondisplaced fracture of body of right calcaneus, initial encounter for closed fracture: Secondary | ICD-10-CM | POA: Diagnosis present

## 2017-03-12 DIAGNOSIS — X58XXXA Exposure to other specified factors, initial encounter: Secondary | ICD-10-CM | POA: Diagnosis not present

## 2017-03-20 ENCOUNTER — Ambulatory Visit: Payer: BC Managed Care – PPO

## 2017-03-21 ENCOUNTER — Other Ambulatory Visit: Payer: BC Managed Care – PPO

## 2017-05-21 ENCOUNTER — Other Ambulatory Visit: Payer: BC Managed Care – PPO

## 2017-06-19 ENCOUNTER — Ambulatory Visit
Admission: RE | Admit: 2017-06-19 | Discharge: 2017-06-19 | Disposition: A | Discharge status: Home / Self Care | Payer: BC Managed Care – PPO | Source: Ambulatory Visit | Attending: Family Medicine | Admitting: Family Medicine

## 2017-06-19 ENCOUNTER — Ambulatory Visit
Admission: RE | Admit: 2017-06-19 | Discharge: 2017-06-19 | Disposition: A | Discharge status: Home / Self Care | Payer: BC Managed Care – PPO | Source: Ambulatory Visit | Attending: Obstetrics and Gynecology | Admitting: Obstetrics and Gynecology

## 2017-06-19 ENCOUNTER — Encounter: Payer: Self-pay | Admitting: Obstetrics and Gynecology

## 2017-06-19 DIAGNOSIS — Z780 Asymptomatic menopausal state: Secondary | ICD-10-CM | POA: Diagnosis not present

## 2017-06-19 DIAGNOSIS — M85852 Other specified disorders of bone density and structure, left thigh: Secondary | ICD-10-CM | POA: Diagnosis not present

## 2017-06-19 DIAGNOSIS — M85859 Other specified disorders of bone density and structure, unspecified thigh: Secondary | ICD-10-CM

## 2017-06-19 DIAGNOSIS — Z1231 Encounter for screening mammogram for malignant neoplasm of breast: Secondary | ICD-10-CM | POA: Diagnosis not present

## 2017-06-19 DIAGNOSIS — Z1239 Encounter for other screening for malignant neoplasm of breast: Secondary | ICD-10-CM

## 2017-06-19 DIAGNOSIS — Z803 Family history of malignant neoplasm of breast: Secondary | ICD-10-CM

## 2017-06-20 ENCOUNTER — Telehealth: Payer: Self-pay | Admitting: Family Medicine

## 2017-06-20 ENCOUNTER — Telehealth: Payer: Self-pay

## 2017-06-20 NOTE — Telephone Encounter (Signed)
-----   Message from Maple Hudsonichard L Gilbert Jr., MD sent at 06/20/2017  1:51 PM EST ----- BMD ok.

## 2017-06-20 NOTE — Telephone Encounter (Signed)
lmtcb-Anastasiya V Hopkins, RMA  

## 2017-06-20 NOTE — Telephone Encounter (Signed)
Pt returned your call.  

## 2017-06-20 NOTE — Telephone Encounter (Signed)
Patient advised.

## 2017-07-23 ENCOUNTER — Other Ambulatory Visit: Payer: Self-pay

## 2017-07-23 DIAGNOSIS — E039 Hypothyroidism, unspecified: Secondary | ICD-10-CM

## 2017-08-15 ENCOUNTER — Encounter: Payer: Self-pay | Admitting: Family Medicine

## 2017-08-15 ENCOUNTER — Ambulatory Visit: Payer: BC Managed Care – PPO | Admitting: Family Medicine

## 2017-08-15 VITALS — BP 152/90 | HR 96 | Temp 98.3°F | Resp 16 | Wt 125.0 lb

## 2017-08-15 DIAGNOSIS — E039 Hypothyroidism, unspecified: Secondary | ICD-10-CM | POA: Diagnosis not present

## 2017-08-15 DIAGNOSIS — I10 Essential (primary) hypertension: Secondary | ICD-10-CM | POA: Diagnosis not present

## 2017-08-15 DIAGNOSIS — F419 Anxiety disorder, unspecified: Secondary | ICD-10-CM | POA: Diagnosis not present

## 2017-08-15 MED ORDER — PAROXETINE HCL 20 MG PO TABS: 20.0000 mg | ORAL_TABLET | Freq: Every day | ORAL | 3 refills | Status: DC

## 2017-08-15 MED ORDER — METOPROLOL SUCCINATE ER 25 MG PO TB24
25.0000 mg | ORAL_TABLET | Freq: Every day | ORAL | 3 refills | Status: DC
Start: 2017-08-15 — End: 2018-12-19

## 2017-08-15 NOTE — Progress Notes (Signed)
Patient: Tina Marshall Female    DOB: Feb 11, 1956   62 y.o.   MRN: [id number] Visit Date: 08/15/2017  Today's Provider: Megan Mans, MD   Chief Complaint  Patient presents with  . Hypertension  . Anxiety   Subjective:    HPI Pt is here today fore anxiety and elevated blood pressure. She reports that she broke her foot in september and her blood pressure has been elevated every since. She has also been having panic attacks. She feels like her heart races when she gets upset and she gets out of breath just walking around the house since her foot fracture. Pt is used to be working out regularly and being active. She reports that yesterday her husband had an accident at work and broke his leg, so he will be having surgery also. Pt was tearful when trying to explain to me what she was here for today.  She reports that she cries a lot. She denies any chest pain. She feels tired and is not sleeping. She is occupied by her sister today.   Depression screen [facility] 2/9 08/15/2017 01/30/2017 01/27/2016  Decreased Interest 0 0 0  Down, Depressed, Hopeless 1 0 0  PHQ - 2 Score 1 0 0  Altered sleeping 3 0 -  Tired, decreased energy 2 0 -  Change in appetite 1 0 -  Feeling bad or failure about yourself  1 0 -  Trouble concentrating 1 0 -  Moving slowly or fidgety/restless 1 0 -  Suicidal thoughts 0 0 -  PHQ-9 Score 10 0 -  Difficult doing work/chores Not difficult at all Not difficult at all -         No Known Allergies   Current Outpatient Medications:  .  aspirin 81 MG EC tablet, Take by mouth., Disp: , Rfl:  .  Cranberry Fruit 475 MG CAPS, Take by mouth., Disp: , Rfl:  .  levothyroxine (SYNTHROID, LEVOTHROID) 88 MCG tablet, TAKE ONE TABLET BY MOUTH ONCE DAILY, Disp: 90 tablet, Rfl: 3 .  MULTIPLE VITAMIN PO, Take by mouth., Disp: , Rfl:  .  Probiotic Product (PROBIOTIC ADVANCED PO), Take by mouth., Disp: , Rfl:   Review of Systems  Constitutional: Positive for activity change  and fatigue.  HENT: Negative.   Eyes: Negative.   Respiratory: Positive for shortness of breath.   Cardiovascular: Negative.   Gastrointestinal: Negative.   Endocrine: Negative.   Genitourinary: Negative.   Musculoskeletal: Negative.   Skin: Negative.   Allergic/Immunologic: Negative.   Neurological: Positive for weakness and headaches.  Hematological: Negative.   Psychiatric/Behavioral: Positive for dysphoric mood and sleep disturbance. The patient is nervous/anxious.     Social History   Tobacco Use  . Smoking status: Never Smoker  . Smokeless tobacco: Never Used  Substance Use Topics  . Alcohol use: Yes    Comment: occ   Objective:   BP (!) 152/90 (BP Location: Right Arm, Patient Position: Sitting, Cuff Size: Normal)   Pulse 96   Temp 98.3 F (36.8 C) (Oral)   Resp 16   Wt 125 lb (56.7 kg)   BMI 23.62 kg/m  Vitals:   08/15/17 1101  BP: (!) 152/90  Pulse: 96  Resp: 16  Temp: 98.3 F (36.8 C)  TempSrc: Oral  Weight: 125 lb (56.7 kg)     Physical Exam  Constitutional: She is oriented to person, place, and time. She appears well-developed and well-nourished.  HENT:  Head: Normocephalic  and atraumatic.  Right Ear: External ear normal.  Left Ear: External ear normal.  Nose: Nose normal.  Eyes: Conjunctivae are normal. No scleral icterus.  Neck: No thyromegaly present.  Cardiovascular: Normal rate, regular rhythm and normal heart sounds.  Neurological: She is alert and oriented to person, place, and time.  Skin: Skin is warm and dry.  Psychiatric: Judgment and thought content normal.  Tearful--not suicidal.        Assessment & Plan:     1. Essential hypertension  - metoprolol succinate (TOPROL-XL) 25 MG 24 hr tablet; Take 1 tablet (25 mg total) by mouth daily. Take with or immediately following a meal.  Dispense: 90 tablet; Refill: 3  2. Anxiety/Adjustment reaction  - PARoxetine (PAXIL) 20 MG tablet; Take 1 tablet (20 mg total) by mouth at bedtime.   Dispense: 90 tablet; Refill: 3  3. Adult hypothyroidism  - TSH      I have done the exam and reviewed the above chart and it is accurate to the best of my knowledge. [employer]  technology has been used in this note in any air is in the dictation or transcription are unintentional.  Megan Mansichard Gilbert Jr, MD  [facility]

## 2017-08-16 ENCOUNTER — Telehealth: Payer: Self-pay | Admitting: Family Medicine

## 2017-08-16 LAB — TSH: TSH: 1.11 u[IU]/mL (ref 0.450–4.500)

## 2017-08-16 NOTE — Telephone Encounter (Signed)
Try taking with food--would try to continue.

## 2017-08-16 NOTE — Telephone Encounter (Signed)
Patient took Paxil around 9:30 last night and woke up at 1:00 with nausea and diarrhea.  Should she continue taking this or continue?

## 2017-08-16 NOTE — Telephone Encounter (Signed)
Pt advised.

## 2017-08-20 ENCOUNTER — Telehealth: Payer: Self-pay | Admitting: Family Medicine

## 2017-08-20 DIAGNOSIS — F419 Anxiety disorder, unspecified: Secondary | ICD-10-CM

## 2017-08-20 MED ORDER — SERTRALINE HCL 50 MG PO TABS: 50.0000 mg | ORAL_TABLET | Freq: Every day | ORAL | 11 refills | Status: DC

## 2017-08-20 NOTE — Telephone Encounter (Signed)
Please advise. Thanks.  

## 2017-08-20 NOTE — Telephone Encounter (Signed)
Sertraline 50mg  daily.dc paxil.

## 2017-08-20 NOTE — Telephone Encounter (Signed)
Patient is still having nausea from Paxil.   Says it's everyday and hard to function.

## 2017-08-20 NOTE — Telephone Encounter (Signed)
Pt advised. Med sent in.  

## 2017-09-19 ENCOUNTER — Encounter: Payer: Self-pay | Admitting: Family Medicine

## 2017-09-19 ENCOUNTER — Ambulatory Visit: Payer: BC Managed Care – PPO | Admitting: Family Medicine

## 2017-09-19 VITALS — BP 140/80 | HR 64 | Temp 98.2°F | Wt 123.6 lb

## 2017-09-19 DIAGNOSIS — I10 Essential (primary) hypertension: Secondary | ICD-10-CM | POA: Diagnosis not present

## 2017-09-19 DIAGNOSIS — F419 Anxiety disorder, unspecified: Secondary | ICD-10-CM | POA: Diagnosis not present

## 2017-09-19 NOTE — Progress Notes (Signed)
Patient: Tina Marshall Female    DOB: 01/19/1956   62 y.o.   MRN: [id number] Visit Date: 09/19/2017  Today's Provider: Megan Mansichard Gilbert Jr, MD   Chief Complaint  Patient presents with  . Follow-up    Anxiety and Hypertension   Subjective:    HPI Patient here today to follow-up for Anxiety. She was seen a month ago and was prescribed Paxil but she was not able to tolerated. She had vomiting and nausea while taking it. Patient was advised to discontinued and Sertraline 50mg  was sent in.  She has the following symptoms: none. Onset of symptoms was approximately 1 month ago, gradually improving since that time.  Previous treatment includes Paxil  she had the following side effects from the treatment: nausea and vomiting. Sertraline 50mg  was sent on 03/11 but patient started to take 1/2 of the pills because she started to feel jittery. Reports she stopped the Sertraline on the 04/04. She report she feels much better. No crying spells and no panic attacks.   Patient is also here to follow-up on BP.   Hypertension, follow-up:  BP Readings from Last 3 Encounters:  09/19/17 140/80  08/15/17 (!) 152/90  03/03/17 (!) 145/70    She was last seen for hypertension 1 months ago.  BP at that visit was 152/90. Management since that visit includes Metoprolol succinate 25mg  24 hr tablet. Patient reports she is taking 12.5 mg.Reports that when she was taking the 25mg  her blood pressure was running 100's-90's bottom number high 50's. She reports excellent compliance with treatment. She is not having side effects.  She is not exercising. She is not adherent to low salt diet.   Outside blood pressures are 130's-120's/70's-60's. She is experiencing headache off and on since last office visit. Patient denies chest pain, chest pressure/discomfort, claudication, exertional chest pressure/discomfort, fatigue, irregular heart beat, lower extremity edema, near-syncope and palpitations.     Cardiovascular risk factors include none.  Use of agents associated with hypertension: none.     Weight trend: stable Wt Readings from Last 3 Encounters:  09/19/17 123 lb 9.6 oz (56.1 kg)  08/15/17 125 lb (56.7 kg)  03/03/17 130 lb (59 kg)    Current diet: in general, a "healthy" diet    ------------------------------------------------------------------------      No Known Allergies   Current Outpatient Medications:  .  Cranberry Fruit 475 MG CAPS, Take by mouth., Disp: , Rfl:  .  levothyroxine (SYNTHROID, LEVOTHROID) 88 MCG tablet, TAKE ONE TABLET BY MOUTH ONCE DAILY, Disp: 90 tablet, Rfl: 3 .  metoprolol succinate (TOPROL-XL) 25 MG 24 hr tablet, Take 1 tablet (25 mg total) by mouth daily. Take with or immediately following a meal. (Patient taking differently: Take 25 mg by mouth daily. Take with or immediately following a meal.), Disp: 90 tablet, Rfl: 3 .  MULTIPLE VITAMIN PO, Take by mouth., Disp: , Rfl:  .  Probiotic Product (PROBIOTIC ADVANCED PO), Take by mouth., Disp: , Rfl:  .  aspirin 81 MG EC tablet, Take by mouth., Disp: , Rfl:  .  sertraline (ZOLOFT) 50 MG tablet, Take 1 tablet (50 mg total) by mouth daily. (Patient not taking: Reported on 09/19/2017), Disp: 30 tablet, Rfl: 11  Review of Systems  Constitutional: Negative.  Negative for fatigue.  Eyes: Negative for visual disturbance.  Respiratory: Negative for choking and chest tightness.   Cardiovascular: Negative for chest pain, palpitations and leg swelling.  Neurological: Positive for headaches. Negative for dizziness and  light-headedness.  Psychiatric/Behavioral: Negative.  Negative for decreased concentration, dysphoric mood, self-injury, sleep disturbance and suicidal ideas. The patient is not nervous/anxious.     Social History   Tobacco Use  . Smoking status: Never Smoker  . Smokeless tobacco: Never Used  Substance Use Topics  . Alcohol use: Yes    Comment: occ   Objective:   BP 140/80 (BP  Location: Left Arm, Patient Position: Sitting, Cuff Size: Normal)   Pulse 64   Temp 98.2 F (36.8 C) (Oral)   Wt 123 lb 9.6 oz (56.1 kg)   SpO2 98%   BMI 23.35 kg/m     Physical Exam  Constitutional: She is oriented to person, place, and time. She appears well-developed and well-nourished.  HENT:  Head: Normocephalic.  Eyes: No scleral icterus.  Neck: No thyromegaly present.  Cardiovascular: Normal rate, regular rhythm and normal heart sounds.  Pulmonary/Chest: Effort normal and breath sounds normal.  Abdominal: Soft.  Neurological: She is alert and oriented to person, place, and time.  Skin: Skin is warm and dry.  Psychiatric: She has a normal mood and affect. Her behavior is normal. Judgment and thought content normal.        Assessment & Plan:  1. Essential hypertension -continue current medication treatment. Patient taking 1/2 (half) of Metoprolol Succinate 25mg . -Follow-up in 1 month.  2. Anxiety Improved. Ok off of meds. Recommend counseling.       Richard Wendelyn Breslow, MD  [facility]

## 2017-10-25 ENCOUNTER — Ambulatory Visit: Payer: BC Managed Care – PPO | Admitting: Family Medicine

## 2017-10-25 VITALS — BP 122/76 | HR 70 | Temp 97.7°F | Resp 16 | Ht 61.0 in | Wt 123.0 lb

## 2017-10-25 DIAGNOSIS — F325 Major depressive disorder, single episode, in full remission: Secondary | ICD-10-CM

## 2017-10-25 DIAGNOSIS — I10 Essential (primary) hypertension: Secondary | ICD-10-CM | POA: Diagnosis not present

## 2017-10-25 DIAGNOSIS — G44209 Tension-type headache, unspecified, not intractable: Secondary | ICD-10-CM

## 2017-10-25 NOTE — Progress Notes (Signed)
Tina Marshall  MRN: [id number] DOB: 03-23-1956  Subjective:  HPI   The patient is a 62 year old female who presents for follow up of her hypertension.  She was last seen on 09/19/17 and at that time she was instructed to take half of her Metoprolol, continue other medications and be seen in 1 month.  The patient reports she feels well on the new dose.  She is getting good readings 110's-120's/60's-70's.  She also reports that her heart rate has been good although she did not record them. Emotionally she feels much better.  BP Readings from Last 3 Encounters:  10/25/17 122/76  09/19/17 140/80  08/15/17 (!) 152/90    Patient Active Problem List   Diagnosis Date Noted  . Essential hypertension 09/19/2017  . Anxiety 09/19/2017  . Family history of breast cancer 02/07/2017  . 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 Affective disorder, major 11/08/2014    Past Medical History:  Diagnosis Date  . Graves disease   . Hypothyroidism     Social History   Socioeconomic History  . Marital status: Married    Spouse name: Not on file  . Number of children: Not on file  . Years of education: Not on file  . Highest education level: Not on file  Occupational History  . Not on file  Social Needs  . Financial resource strain: Not on file  . Food insecurity:    Worry: Not on file    Inability: Not on file  . Transportation needs:    Medical: Not on file    Non-medical: Not on file  Tobacco Use  . Smoking status: Never Smoker  . Smokeless tobacco: Never Used  Substance and Sexual Activity  . Alcohol use: Yes    Comment: occ  . Drug use: No  . Sexual activity: Not on file  Lifestyle  . Physical activity:    Days per week: Not on file    Minutes per session: Not on file  . Stress: Not on file  Relationships  . Social connections:    Talks on phone: Not on file    Gets together: Not on file    Attends religious service: Not on file    Active  member of club or organization: Not on file    Attends meetings of clubs or organizations: Not on file    Relationship status: Not on file  . Intimate partner violence:    Fear of current or ex partner: Not on file    Emotionally abused: Not on file    Physically abused: Not on file    Forced sexual activity: Not on file  Other Topics Concern  . Not on file  Social History Narrative  . Not on file    Outpatient Encounter Medications as of 10/25/2017  Medication Sig Note  . aspirin 81 MG EC tablet Take by mouth. 11/08/2014: Received from: [facility]  . Cranberry Fruit 475 MG CAPS Take by mouth. 11/08/2014: Received from: [facility]  . levothyroxine (SYNTHROID, LEVOTHROID) 88 MCG tablet TAKE ONE TABLET BY MOUTH ONCE DAILY   . metoprolol succinate (TOPROL-XL) 25 MG 24 hr tablet Take 1 tablet (25 mg total) by mouth daily. Take with or immediately following a meal. (Patient taking differently: Take 25 mg by mouth daily. Take with or immediately following a meal.)   . MULTIPLE VITAMIN PO Take by mouth. 11/08/2014: Received from: [facility]  . Probiotic Product (  PROBIOTIC ADVANCED PO) Take by mouth.   . [DISCONTINUED] sertraline (ZOLOFT) 50 MG tablet Take 1 tablet (50 mg total) by mouth daily.    No facility-administered encounter medications on file as of 10/25/2017.     No Known Allergies  Review of Systems  Constitutional: Negative for fever and malaise/fatigue.  Eyes: Negative.   Respiratory: Negative for cough, shortness of breath and wheezing.   Cardiovascular: Negative for chest pain, palpitations, orthopnea, claudication and leg swelling.  Gastrointestinal: Negative.   Skin: Negative.   Neurological: Positive for headaches. Negative for dizziness.  Endo/Heme/Allergies: Negative.   Psychiatric/Behavioral: Negative.     Objective:  BP 122/76 (BP Location: Right Arm, Patient Position: Sitting, Cuff Size: Normal)   Pulse 70    Temp 97.7 F (36.5 C) (Oral)   Resp 16   Ht  (1.549 m)   Wt 123 lb (55.8 kg)   BMI 23.24 kg/m   Physical Exam  Constitutional: She is oriented to person, place, and time and well-developed, well-nourished, and in no distress.  HENT:  Head: Normocephalic and atraumatic.  Eyes: Conjunctivae are normal. No scleral icterus.  Neck: No thyromegaly present.  Cardiovascular: Normal rate, regular rhythm and normal heart sounds.  Pulmonary/Chest: Effort normal and breath sounds normal.  Abdominal: Soft.  Neurological: She is alert and oriented to person, place, and time. Gait normal. GCS score is 15.  Skin: Skin is warm and dry.  Psychiatric: Mood, memory, affect and judgment normal.    Assessment and Plan :  HTN Adjustment Reaction Tension Headache RTC 2 months.  I have done the exam and reviewed the chart and it is accurate to the best of my knowledge. Dentist has been used and  any errors in dictation or transcription are unintentional. Julieanne Manson M.D. [facility]

## 2017-12-25 ENCOUNTER — Encounter: Payer: Self-pay | Admitting: Family Medicine

## 2017-12-25 ENCOUNTER — Ambulatory Visit: Payer: BC Managed Care – PPO | Admitting: Family Medicine

## 2017-12-25 VITALS — BP 128/70 | HR 74 | Temp 98.2°F | Resp 16 | Wt 129.0 lb

## 2017-12-25 DIAGNOSIS — F325 Major depressive disorder, single episode, in full remission: Secondary | ICD-10-CM

## 2017-12-25 DIAGNOSIS — F419 Anxiety disorder, unspecified: Secondary | ICD-10-CM | POA: Diagnosis not present

## 2017-12-25 DIAGNOSIS — I10 Essential (primary) hypertension: Secondary | ICD-10-CM

## 2017-12-25 NOTE — Progress Notes (Signed)
Patient: Tina Marshall Female    DOB: [birth year]   62 y.o.   MRN: [id number] Visit Date: 12/25/2017  Today's Provider: Megan Mans, MD   Chief Complaint  Patient presents with  . Hypertension   Subjective:    HPI Patient comes in today for a follow up on hypertension.   Hypertension, follow-up:  BP Readings from Last 3 Encounters:  12/25/17 128/70  10/25/17 122/76  09/19/17 140/80    She was last seen for hypertension 2 months ago.  BP at that visit was 122/76. Management since that visit includes no changes. She reports good compliance with treatment. She is not having side effects.  She is not exercising.  She is adherent to low salt diet.   Outside blood pressures are checked daily. BPs has averaged in the 130s/70s She is experiencing none.  Patient denies exertional chest pressure/discomfort, lower extremity edema and palpitations.    Weight trend: stable Wt Readings from Last 3 Encounters:  12/25/17 129 lb (58.5 kg)  10/25/17 123 lb (55.8 kg)  09/19/17 123 lb 9.6 oz (56.1 kg)    Current diet: well balanced    Patient reports that her migraines are a lot better. She reports that her dentist advised her that she was grinding her teeth at night, and it was causing her to have headaches.  No Known Allergies   Current Outpatient Medications:  .  aspirin 81 MG EC tablet, Take by mouth., Disp: , Rfl:  .  Cranberry Fruit 475 MG CAPS, Take by mouth., Disp: , Rfl:  .  levothyroxine (SYNTHROID, LEVOTHROID) 88 MCG tablet, TAKE ONE TABLET BY MOUTH ONCE DAILY, Disp: 90 tablet, Rfl: 3 .  metoprolol succinate (TOPROL-XL) 25 MG 24 hr tablet, Take 1 tablet (25 mg total) by mouth daily. Take with or immediately following a meal. (Patient taking differently: Take 25 mg by mouth daily. Take with or immediately following a meal.), Disp: 90 tablet, Rfl: 3 .  MULTIPLE VITAMIN PO, Take by mouth., Disp: , Rfl:  .  Probiotic Product (PROBIOTIC ADVANCED PO), Take by  mouth., Disp: , Rfl:   Review of Systems  Constitutional: Negative for activity change, appetite change, chills, diaphoresis, fatigue, fever and unexpected weight change.  HENT: Negative.   Eyes: Negative.   Cardiovascular: Negative for chest pain, palpitations and leg swelling.  Musculoskeletal: Negative.   Allergic/Immunologic: Negative.   Neurological: Negative for dizziness, facial asymmetry, weakness, light-headedness, numbness and headaches.    Social History   Tobacco Use  . Smoking status: Never Smoker  . Smokeless tobacco: Never Used  Substance Use Topics  . Alcohol use: Yes    Comment: occ   Objective:   BP 128/70 (BP Location: Right Arm, Patient Position: Sitting, Cuff Size: Normal)   Pulse 74   Temp 98.2 F (36.8 C)   Resp 16   Wt 129 lb (58.5 kg)   SpO2 97%   BMI 24.37 kg/m  Vitals:   12/25/17 1009  BP: 128/70  Pulse: 74  Resp: 16  Temp: 98.2 F (36.8 C)  SpO2: 97%  Weight: 129 lb (58.5 kg)     Physical Exam  Constitutional: She is oriented to person, place, and time. She appears well-developed and well-nourished.  HENT:  Head: Normocephalic and atraumatic.  Right Ear: External ear normal.  Left Ear: External ear normal.  Nose: Nose normal.  Eyes: Conjunctivae are normal. No scleral icterus.  Neck: No thyromegaly present.  Cardiovascular: Normal rate, regular  rhythm and normal heart sounds.  Pulmonary/Chest: Effort normal and breath sounds normal.  Abdominal: Soft.  Musculoskeletal: She exhibits no edema.  Neurological: She is alert and oriented to person, place, and time.  Skin: Skin is warm and dry.  Psychiatric: She has a normal mood and affect. Her behavior is normal. Judgment and thought content normal.        Assessment & Plan:     HTN MDD/GAD All stable--RTC 6 months.     I have done the exam and reviewed the above chart and it is accurate to the best of my knowledge. DentistDragon  technology has been used in this note in any air is  in the dictation or transcription are unintentional.  Megan Mansichard Coley Kulikowski Jr, MD  [facility]

## 2018-01-19 ENCOUNTER — Other Ambulatory Visit: Payer: Self-pay | Admitting: Family Medicine

## 2018-01-19 DIAGNOSIS — E039 Hypothyroidism, unspecified: Secondary | ICD-10-CM

## 2018-02-20 ENCOUNTER — Ambulatory Visit (INDEPENDENT_AMBULATORY_CARE_PROVIDER_SITE_OTHER): Payer: BC Managed Care – PPO | Admitting: Family Medicine

## 2018-02-20 VITALS — BP 130/78 | HR 75 | Temp 98.1°F | Resp 16 | Ht 61.0 in | Wt 126.0 lb

## 2018-02-20 DIAGNOSIS — Z0000 Encounter for general adult medical examination without abnormal findings: Secondary | ICD-10-CM | POA: Diagnosis not present

## 2018-02-20 NOTE — Progress Notes (Signed)
Patient: Tina Marshall, Female    DOB: 1956-01-24, 62 y.o.   MRN: [id number] Visit Date: 02/20/2018  Today's Provider: Megan Mans, MD   Chief Complaint  Patient presents with  . Annual Exam   Subjective:  Tina Marshall is a 62 y.o. female who presents today for health maintenance and complete physical. She feels well. She reports exercising 3 times weekly. She reports she is sleeping well. She feels well. 04/08/13 Colonoscopy, Wohl-Benign Submucosal Leiomyoma 06/19/17 BMD-Osteopenic 06/19/17 Mammogram-Negative 02/02/15 Pap smear-Nl, HPV-neg   Review of Systems  Constitutional: Negative.   HENT: Negative.   Eyes: Negative.   Respiratory: Negative.   Cardiovascular: Negative.   Gastrointestinal: Negative.   Endocrine: Negative.   Genitourinary: Negative.   Musculoskeletal: Negative.   Skin: Negative.   Allergic/Immunologic: Negative.   Neurological: Negative.   Psychiatric/Behavioral: Negative.     Social History   Socioeconomic History  . Marital status: Married    Spouse name: Not on file  . Number of children: Not on file  . Years of education: Not on file  . Highest education level: Not on file  Occupational History  . Not on file  Social Needs  . Financial resource strain: Not on file  . Food insecurity:    Worry: Not on file    Inability: Not on file  . Transportation needs:    Medical: Not on file    Non-medical: Not on file  Tobacco Use  . Smoking status: Never Smoker  . Smokeless tobacco: Never Used  Substance and Sexual Activity  . Alcohol use: Yes    Comment: occ  . Drug use: No  . Sexual activity: Not on file  Lifestyle  . Physical activity:    Days per week: Not on file    Minutes per session: Not on file  . Stress: Not on file  Relationships  . Social connections:    Talks on phone: Not on file    Gets together: Not on file    Attends religious service: Not on file    Active member of club or organization: Not on file    Attends  meetings of clubs or organizations: Not on file    Relationship status: Not on file  . Intimate partner violence:    Fear of current or ex partner: Not on file    Emotionally abused: Not on file    Physically abused: Not on file    Forced sexual activity: Not on file  Other Topics Concern  . Not on file  Social History Narrative  . Not on file    Patient Active Problem List   Diagnosis Date Noted  . Essential hypertension 09/19/2017  . Anxiety 09/19/2017  . Family history of breast cancer 02/07/2017  . 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 Affective disorder, major 11/08/2014    Past Surgical History:  Procedure Laterality Date  . CESAREAN SECTION      Her family history includes Anemia in her sister; Atrial fibrillation in her mother; Breast cancer (age of onset: 19) in her paternal aunt; Breast cancer (age of onset: 55) in her cousin; Breast cancer (age of onset: 31) in her sister; Breast cancer (age of onset: 61) in her maternal grandmother; Dementia in her father; Heart attack in her maternal grandfather and paternal grandfather; Heart disease in her father; Hypertension in her brother, other, and sister; Pancreatic cancer in her paternal grandmother; Parkinson's disease in her father.  Outpatient Encounter Medications as of 02/20/2018  Medication Sig Note  . aspirin 81 MG EC tablet Take by mouth. 11/08/2014: Received from: [facility]  . Cranberry Fruit 475 MG CAPS Take by mouth. 11/08/2014: Received from: [facility]  . levothyroxine (SYNTHROID, LEVOTHROID) 88 MCG tablet TAKE 1 TABLET BY MOUTH ONCE DAILY   . metoprolol succinate (TOPROL-XL) 25 MG 24 hr tablet Take 1 tablet (25 mg total) by mouth daily. Take with or immediately following a meal. (Patient taking differently: Take 25 mg by mouth daily. Take with or immediately following a meal.)   . MULTIPLE VITAMIN PO Take by mouth. 11/08/2014: Received from:  [facility]  . Probiotic Product (PROBIOTIC ADVANCED PO) Take by mouth.    No facility-administered encounter medications on file as of 02/20/2018.     Patient Care Team: Maple Hudson., MD as PCP - General (Family Medicine)      Objective:   Vitals:  Vitals:   02/20/18 1025  BP: 130/78  Pulse: 75  Resp: 16  Temp: 98.1 F (36.7 C)  TempSrc: Oral  Weight: 126 lb (57.2 kg)  Height: 5\' 1"  (1.549 m)    Physical Exam  Constitutional: She is oriented to person, place, and time. She appears well-developed and well-nourished.  HENT:  Head: Normocephalic and atraumatic.  Right Ear: External ear normal.  Left Ear: External ear normal.  Nose: Nose normal.  Mouth/Throat: Oropharynx is clear and moist.  Eyes: Pupils are equal, round, and reactive to light. Conjunctivae and EOM are normal.  Neck: Normal range of motion. Neck supple.  Cardiovascular: Normal rate, regular rhythm, normal heart sounds and intact distal pulses.  Pulmonary/Chest: Effort normal and breath sounds normal.  Abdominal: Soft. Bowel sounds are normal.  Genitourinary:  Genitourinary Comments: Breast exam benign.  Musculoskeletal: Normal range of motion.  Neurological: She is alert and oriented to person, place, and time.  Skin: Skin is warm and dry.  Psychiatric: She has a normal mood and affect. Her behavior is normal. Judgment and thought content normal.     Depression Screen PHQ 2/9 Scores 02/20/2018 08/15/2017 01/30/2017 01/27/2016  PHQ - 2 Score 0 1 0 0  PHQ- 9 Score - 10 0 -      Assessment & Plan:     Routine Health Maintenance and Physical Exam  Exercise Activities and Dietary recommendations Goals   None     Immunization History  Administered Date(s) Administered  . Influenza Inj Mdck Quad Pf 03/18/2017  . Influenza,inj,Quad PF,6+ Mos 03/12/2015  . Influenza,inj,quad, With Preservative 03/24/2016  . Tdap 01/04/2012  . Zoster 12/07/2010    Health Maintenance   Topic Date Due  . Hepatitis C Screening  [birth year]  . HIV Screening  07/16/1971  . INFLUENZA VACCINE  01/10/2018  . PAP SMEAR  02/01/2018  . MAMMOGRAM  06/20/2019  . TETANUS/TDAP  01/03/2022  . COLONOSCOPY  04/09/2023    Gyn care per [facility]. Colonoscopy 2024. Discussed health benefits of physical activity, and encouraged her to engage in regular exercise appropriate for her age and condition. RTC 6 months.   I have done the exam and reviewed the chart and it is accurate to the best of my knowledge. Dentist has been used and  any errors in dictation or transcription are unintentional. Julieanne Manson M.D. [facility]

## 2018-02-21 LAB — COMPREHENSIVE METABOLIC PANEL
ALT: 27 IU/L (ref 0–32)
AST: 24 IU/L (ref 0–40)
Albumin/Globulin Ratio: 1.8 (ref 1.2–2.2)
Albumin: 4.6 g/dL (ref 3.6–4.8)
Alkaline Phosphatase: 77 IU/L (ref 39–117)
BUN/Creatinine Ratio: 24 (ref 12–28)
BUN: 14 mg/dL (ref 8–27)
Bilirubin Total: 0.7 mg/dL (ref 0.0–1.2)
CO2: 22 mmol/L (ref 20–29)
Calcium: 9.8 mg/dL (ref 8.7–10.3)
Chloride: 106 mmol/L (ref 96–106)
Creatinine, Ser: 0.58 mg/dL (ref 0.57–1.00)
GFR calc Af Amer: 115 mL/min/{1.73_m2} (ref >59)
GFR calc non Af Amer: 100 mL/min/{1.73_m2} (ref >59)
Globulin, Total: 2.6 g/dL (ref 1.5–4.5)
Glucose: 87 mg/dL (ref 65–99)
Potassium: 4.1 mmol/L (ref 3.5–5.2)
Sodium: 143 mmol/L (ref 134–144)
Total Protein: 7.2 g/dL (ref 6.0–8.5)

## 2018-02-21 LAB — CBC WITH DIFFERENTIAL/PLATELET
Basophils Absolute: 0 10*3/uL (ref 0.0–0.2)
Basos: 0 %
EOS (ABSOLUTE): 0 10*3/uL (ref 0.0–0.4)
Eos: 0 %
Hematocrit: 36.9 % (ref 34.0–46.6)
Hemoglobin: 12.9 g/dL (ref 11.1–15.9)
Immature Grans (Abs): 0 10*3/uL (ref 0.0–0.1)
Immature Granulocytes: 0 %
Lymphocytes Absolute: 2.5 10*3/uL (ref 0.7–3.1)
Lymphs: 22 %
MCH: 33.4 pg — ABNORMAL HIGH (ref 26.6–33.0)
MCHC: 35 g/dL (ref 31.5–35.7)
MCV: 96 fL (ref 79–97)
Monocytes Absolute: 0.9 10*3/uL (ref 0.1–0.9)
Monocytes: 8 %
Neutrophils Absolute: 8 10*3/uL — ABNORMAL HIGH (ref 1.4–7.0)
Neutrophils: 70 %
Platelets: 303 10*3/uL (ref 150–450)
RBC: 3.86 x10E6/uL (ref 3.77–5.28)
RDW: 13 % (ref 12.3–15.4)
WBC: 11.5 10*3/uL — ABNORMAL HIGH (ref 3.4–10.8)

## 2018-02-21 LAB — LIPID PANEL WITH LDL/HDL RATIO
Cholesterol, Total: 165 mg/dL (ref 100–199)
HDL: 47 mg/dL (ref >39)
LDL Calculated: 99 mg/dL (ref 0–99)
LDl/HDL Ratio: 2.1 ratio (ref 0.0–3.2)
Triglycerides: 97 mg/dL (ref 0–149)
VLDL Cholesterol Cal: 19 mg/dL (ref 5–40)

## 2018-02-21 LAB — TSH: TSH: 0.376 u[IU]/mL — ABNORMAL LOW (ref 0.450–4.500)

## 2018-02-25 ENCOUNTER — Telehealth: Payer: Self-pay | Admitting: Family Medicine

## 2018-02-25 DIAGNOSIS — E039 Hypothyroidism, unspecified: Secondary | ICD-10-CM

## 2018-02-25 MED ORDER — LEVOTHYROXINE SODIUM 75 MCG PO TABS: 75.0000 ug | ORAL_TABLET | Freq: Every day | ORAL | 5 refills | Status: DC

## 2018-02-25 NOTE — Telephone Encounter (Signed)
Patient calling with concerned  about her blood work from last week [facility] was concerned and would like to speak with Dr's nurse. Thanks CC

## 2018-02-25 NOTE — Telephone Encounter (Signed)
-----   Message from Maple Hudsonichard L Gilbert Jr., MD sent at 02/25/2018  8:52 AM EDT ----- Labs okay.  Thyroid overtreated.  Decrease Synthroid from 88 to 75 mcg daily

## 2018-02-25 NOTE — Telephone Encounter (Signed)
Yes and 3 months.

## 2018-02-25 NOTE — Telephone Encounter (Signed)
Advised patient as below. Patient will call back and schedule appt.

## 2018-02-25 NOTE — Telephone Encounter (Signed)
Advised patient of results. Patient wanted to know that since she does not have a thyroid, why did her levels change? She reports that it has been consistent for several years. Could stress contribute to her TSH levels fluctuating? Please advise.  Patient also wants to have her TSH rechecked after dose change. How many weeks should she wait to have this checked?

## 2018-04-02 ENCOUNTER — Telehealth: Payer: Self-pay | Admitting: Family Medicine

## 2018-04-02 DIAGNOSIS — E039 Hypothyroidism, unspecified: Secondary | ICD-10-CM

## 2018-04-02 NOTE — Telephone Encounter (Signed)
Pt was in on 9/11 and seen for thyroid.  At that time her meds were changed and she was told to have her labs rechecked in 6 to 8  Weeks.  She wants to know if she is to see Dr. Sullivan Lone or just go to the lab.  She would like to go to the lab tomorrow if possible  CB#  [phone]  Thanks teri

## 2018-04-05 ENCOUNTER — Telehealth: Payer: Self-pay | Admitting: Family Medicine

## 2018-04-05 LAB — TSH: TSH: 3.54 u[IU]/mL (ref 0.450–4.500)

## 2018-04-05 NOTE — Telephone Encounter (Signed)
-----   Message from Maple Hudson., MD sent at 04/05/2018 10:03 AM EDT ----- Thyroid normal level.

## 2018-04-05 NOTE — Telephone Encounter (Signed)
Left message to call back  

## 2018-04-05 NOTE — Telephone Encounter (Signed)
Pt stated that she saw her TSH results on line and would like to discuss levels with CMA. Please advise. Thanks TNP

## 2018-04-08 NOTE — Telephone Encounter (Signed)
Patient states she would like to have an adjustment in her medicine because she is dragging.  Her level is normally around 1 and now it is over 3.  I explained that she was in the normal range.  She understood that but said she is use to the 1.1 level and can tell the difference.  Do you want to change her medicine or ref to endocrine?

## 2018-04-08 NOTE — Telephone Encounter (Signed)
Pt returned call for lab work  Thanks [employer]

## 2018-04-10 NOTE — Telephone Encounter (Signed)
LMTCB ED 

## 2018-04-10 NOTE — Telephone Encounter (Signed)
May increase synthroid from 75 to daily--RTC 3 months.

## 2018-04-10 NOTE — Telephone Encounter (Signed)
Pr advised.  She still has on hand (Still in date) so does not need an RX for now.  She will call back to schedule an appointment/labwork in a few weeks.   Thanks,   -Vernona Rieger

## 2018-05-20 ENCOUNTER — Other Ambulatory Visit: Payer: Self-pay | Admitting: Family Medicine

## 2018-05-20 DIAGNOSIS — Z1231 Encounter for screening mammogram for malignant neoplasm of breast: Secondary | ICD-10-CM

## 2018-06-21 ENCOUNTER — Ambulatory Visit
Admission: RE | Admit: 2018-06-21 | Discharge: 2018-06-21 | Disposition: A | Discharge status: Home / Self Care | Payer: BC Managed Care – PPO | Source: Ambulatory Visit | Attending: Family Medicine | Admitting: Family Medicine

## 2018-06-21 DIAGNOSIS — Z1231 Encounter for screening mammogram for malignant neoplasm of breast: Secondary | ICD-10-CM | POA: Insufficient documentation

## 2018-08-21 ENCOUNTER — Ambulatory Visit: Payer: Self-pay | Admitting: Family Medicine

## 2018-08-22 ENCOUNTER — Ambulatory Visit: Payer: Self-pay | Admitting: Family Medicine

## 2018-08-27 ENCOUNTER — Ambulatory Visit: Payer: Self-pay | Admitting: Family Medicine

## 2018-10-07 ENCOUNTER — Telehealth: Payer: Self-pay | Admitting: Family Medicine

## 2018-10-07 NOTE — Telephone Encounter (Signed)
Advised patient and cancelled appt. Patient reports that she will call back to reschedule.

## 2018-10-07 NOTE — Telephone Encounter (Signed)
Pt needing to know if she can come in just to do labs?  She has an appt on Thurs and feeling she shouldn't come in.  Needing to know what is best to do.  Please advise.  Thanks, [employer]

## 2018-10-07 NOTE — Telephone Encounter (Signed)
We can put off her visit a couple of months.  Lab can wait a couple of months also as long as she is feeling well.

## 2018-10-07 NOTE — Telephone Encounter (Signed)
Please review. Thanks!  

## 2018-10-10 ENCOUNTER — Ambulatory Visit: Payer: Self-pay | Admitting: Family Medicine

## 2018-12-19 ENCOUNTER — Other Ambulatory Visit: Payer: Self-pay | Admitting: Family Medicine

## 2018-12-19 DIAGNOSIS — I10 Essential (primary) hypertension: Secondary | ICD-10-CM

## 2018-12-19 NOTE — Telephone Encounter (Signed)
Pharmacy requesting refills. Patient was last seen 02/2018. Ok to fill?

## 2019-02-26 ENCOUNTER — Other Ambulatory Visit: Payer: Self-pay | Admitting: Family Medicine

## 2019-02-26 DIAGNOSIS — E039 Hypothyroidism, unspecified: Secondary | ICD-10-CM

## 2019-03-04 ENCOUNTER — Encounter: Payer: Self-pay | Admitting: Family Medicine

## 2019-03-04 ENCOUNTER — Ambulatory Visit: Payer: BC Managed Care – PPO | Admitting: Family Medicine

## 2019-03-04 ENCOUNTER — Other Ambulatory Visit: Payer: Self-pay

## 2019-03-04 VITALS — BP 136/78 | HR 74 | Resp 16 | Ht 61.0 in | Wt 134.0 lb

## 2019-03-04 DIAGNOSIS — M8580 Other specified disorders of bone density and structure, unspecified site: Secondary | ICD-10-CM

## 2019-03-04 DIAGNOSIS — I10 Essential (primary) hypertension: Secondary | ICD-10-CM | POA: Diagnosis not present

## 2019-03-04 DIAGNOSIS — E039 Hypothyroidism, unspecified: Secondary | ICD-10-CM

## 2019-03-04 NOTE — Progress Notes (Signed)
Patient: Tina Marshall Female    DOB: 1955-10-12   63 y.o.   MRN: [id number] Visit Date: 03/04/2019  Today's Provider: Megan Mans, MD   Chief Complaint  Patient presents with  . Hypertension  . Hypothyroidism   Subjective:   HPI  Hypertension, follow-up:  BP Readings from Last 3 Encounters:  03/04/19 136/78  02/20/18 130/78  12/25/17 128/70    She was last seen for hypertension 1 years ago.  BP at that visit was 130/78. Management since that visit includes no changes. She reports good compliance with treatment. She is not having side effects.  She is exercising. She is adherent to low salt diet.   Outside blood pressures are not being checked. She is experiencing none.  Patient denies exertional chest pressure/discomfort, lower extremity edema and palpitations.    Weight trend: stable Wt Readings from Last 3 Encounters:  03/04/19 134 lb (60.8 kg)  02/20/18 126 lb (57.2 kg)  12/25/17 129 lb (58.5 kg)    Current diet: well balanced  Hypothyroidism, follow up: Patient was last seen 1 year ago. No medications were changed since last visit. She is currently taking levothyroxine daily. She reports good compliance, and good symptom control.  Lab Results  Component Value Date   TSH 3.540 04/04/2018    No Known Allergies   Current Outpatient Medications:  .  aspirin 81 MG EC tablet, Take by mouth., Disp: , Rfl:  .  Cranberry Fruit 475 MG CAPS, Take by mouth., Disp: , Rfl:  .  levothyroxine (SYNTHROID) 88 MCG tablet, Take 1 tablet by mouth once daily, Disp: 90 tablet, Rfl: 4 .  metoprolol succinate (TOPROL-XL) 25 MG 24 hr tablet, TAKE 1 TABLET BY MOUTH ONCE DAILY TAKE  WITH  OR  IMMEDIATELY  FOLLOWING  A  MEAL, Disp: 90 tablet, Rfl: 3 .  MULTIPLE VITAMIN PO, Take by mouth., Disp: , Rfl:  .  Probiotic Product (PROBIOTIC ADVANCED PO), Take by mouth., Disp: , Rfl:  .  levothyroxine (SYNTHROID, LEVOTHROID) 75 MCG tablet, Take 1 tablet (75 mcg  total) by mouth daily. (Patient not taking: Reported on 03/04/2019), Disp: 30 tablet, Rfl: 5  Review of Systems  Constitutional: Negative for activity change and fatigue.  Eyes: Negative.   Respiratory: Negative for cough and shortness of breath.   Cardiovascular: Negative for chest pain, palpitations and leg swelling.  Endocrine: Negative for cold intolerance, heat intolerance, polyphagia and polyuria.  Musculoskeletal: Negative for arthralgias and joint swelling.  Allergic/Immunologic: Negative for environmental allergies.  Neurological: Negative for dizziness, light-headedness and headaches.  Hematological: Negative.   Psychiatric/Behavioral: Negative for agitation, self-injury, sleep disturbance and suicidal ideas. The patient is not nervous/anxious.     Social History   Tobacco Use  . Smoking status: Never Smoker  . Smokeless tobacco: Never Used  Substance Use Topics  . Alcohol use: Yes    Comment: occ      Objective:   BP 136/78   Pulse 74   Resp 16   Ht 5\' 1"  (1.549 m)   Wt 134 lb (60.8 kg)   SpO2 99%   BMI 25.32 kg/m  Vitals:   03/04/19 1343  BP: 136/78  Pulse: 74  Resp: 16  SpO2: 99%  Weight: 134 lb (60.8 kg)  Height: 5\' 1"  (1.549 m)  Body mass index is 25.32 kg/m.   Physical Exam Vitals signs reviewed.  Constitutional:      Appearance: She is well-developed.  HENT:  Head: Normocephalic and atraumatic.     Right Ear: External ear normal.     Left Ear: External ear normal.     Nose: Nose normal.  Eyes:     General: No scleral icterus.    Conjunctiva/sclera: Conjunctivae normal.  Neck:     Thyroid: No thyromegaly.  Cardiovascular:     Rate and Rhythm: Normal rate and regular rhythm.     Heart sounds: Normal heart sounds.  Pulmonary:     Effort: Pulmonary effort is normal.     Breath sounds: Normal breath sounds.  Abdominal:     Palpations: Abdomen is soft.  Skin:    General: Skin is warm and dry.  Neurological:     Mental Status: She is  alert and oriented to person, place, and time.  Psychiatric:        Behavior: Behavior normal.        Thought Content: Thought content normal.        Judgment: Judgment normal.      No results found for any visits on 03/04/19.     Assessment & Plan    1. Adult hypothyroidism  - TSH  2. Essential hypertension Controlled on Toprol. - CBC with Differential/Platelet - Comprehensive metabolic panel  3. Osteopenia, unspecified location Stop MVI and start Vid and Calcium daily.      Cranford Mon, MD  [facility]

## 2019-03-04 NOTE — Patient Instructions (Signed)
Stop multi vitamin.  Start Calcium and Vitamin D supplements daily.

## 2019-03-05 ENCOUNTER — Telehealth: Payer: Self-pay | Admitting: Family Medicine

## 2019-03-05 ENCOUNTER — Other Ambulatory Visit: Payer: Self-pay

## 2019-03-05 DIAGNOSIS — E039 Hypothyroidism, unspecified: Secondary | ICD-10-CM

## 2019-03-05 LAB — CBC WITH DIFFERENTIAL/PLATELET
Basophils Absolute: 0.1 10*3/uL (ref 0.0–0.2)
Basos: 1 %
EOS (ABSOLUTE): 0.8 10*3/uL — ABNORMAL HIGH (ref 0.0–0.4)
Eos: 11 %
Hematocrit: 40.5 % (ref 34.0–46.6)
Hemoglobin: 14.3 g/dL (ref 11.1–15.9)
Immature Grans (Abs): 0 10*3/uL (ref 0.0–0.1)
Immature Granulocytes: 0 %
Lymphocytes Absolute: 2.4 10*3/uL (ref 0.7–3.1)
Lymphs: 34 %
MCH: 33.6 pg — ABNORMAL HIGH (ref 26.6–33.0)
MCHC: 35.3 g/dL (ref 31.5–35.7)
MCV: 95 fL (ref 79–97)
Monocytes Absolute: 0.5 10*3/uL (ref 0.1–0.9)
Monocytes: 7 %
Neutrophils Absolute: 3.2 10*3/uL (ref 1.4–7.0)
Neutrophils: 47 %
Platelets: 303 10*3/uL (ref 150–450)
RBC: 4.25 x10E6/uL (ref 3.77–5.28)
RDW: 12.8 % (ref 11.7–15.4)
WBC: 7.1 10*3/uL (ref 3.4–10.8)

## 2019-03-05 LAB — COMPREHENSIVE METABOLIC PANEL
ALT: 30 IU/L (ref 0–32)
AST: 32 IU/L (ref 0–40)
Albumin/Globulin Ratio: 1.9 (ref 1.2–2.2)
Albumin: 4.6 g/dL (ref 3.8–4.8)
Alkaline Phosphatase: 92 IU/L (ref 39–117)
BUN/Creatinine Ratio: 15 (ref 12–28)
BUN: 11 mg/dL (ref 8–27)
Bilirubin Total: 0.4 mg/dL (ref 0.0–1.2)
CO2: 22 mmol/L (ref 20–29)
Calcium: 9.6 mg/dL (ref 8.7–10.3)
Chloride: 104 mmol/L (ref 96–106)
Creatinine, Ser: 0.73 mg/dL (ref 0.57–1.00)
GFR calc Af Amer: 102 mL/min/{1.73_m2} (ref >59)
GFR calc non Af Amer: 89 mL/min/{1.73_m2} (ref >59)
Globulin, Total: 2.4 g/dL (ref 1.5–4.5)
Glucose: 82 mg/dL (ref 65–99)
Potassium: 4.2 mmol/L (ref 3.5–5.2)
Sodium: 142 mmol/L (ref 134–144)
Total Protein: 7 g/dL (ref 6.0–8.5)

## 2019-03-05 LAB — TSH: TSH: 0.509 u[IU]/mL (ref 0.450–4.500)

## 2019-03-05 MED ORDER — LEVOTHYROXINE SODIUM 75 MCG PO TABS: 75.0000 ug | ORAL_TABLET | Freq: Every day | ORAL | 5 refills | Status: DC

## 2019-03-05 NOTE — Telephone Encounter (Signed)
Advised patient and sent Rx to pharmacy. 

## 2019-03-05 NOTE — Telephone Encounter (Signed)
Pt called saying she seen her labs in her My chart and would like someone to call her back.  Tina Marshall

## 2019-03-05 NOTE — Telephone Encounter (Signed)
Decrease then to 75 mcg daily.

## 2019-03-05 NOTE — Telephone Encounter (Signed)
Advised patient of results. She reports that she feels best when her TSH level is around 1-1.5. She is asking if her medication needs to be adjusted? She currently takes levothyroxine 72mcg daily. Please advise. Thanks!

## 2019-03-05 NOTE — Telephone Encounter (Signed)
-----   Message from Jerrol Banana., MD sent at 03/05/2019  8:47 AM EDT ----- Labs okay.

## 2019-05-12 ENCOUNTER — Telehealth: Payer: Self-pay

## 2019-05-12 NOTE — Telephone Encounter (Signed)
Copied from [place] [phone]. Topic: Appointment Scheduling - Scheduling Inquiry for Clinic >> May 12, 2019  3:01 PM Reyne Dumas L wrote: Reason for CRM:  Pt wants to know when she is due for next thyroid check.

## 2019-05-13 ENCOUNTER — Encounter: Payer: BC Managed Care – PPO | Admitting: Family Medicine

## 2019-05-13 NOTE — Telephone Encounter (Signed)
It appears she canceld her appointment for today but has a future appointment for March

## 2019-05-22 ENCOUNTER — Other Ambulatory Visit: Payer: Self-pay | Admitting: Family Medicine

## 2019-05-22 DIAGNOSIS — Z1231 Encounter for screening mammogram for malignant neoplasm of breast: Secondary | ICD-10-CM

## 2019-06-02 ENCOUNTER — Ambulatory Visit: Payer: BC Managed Care – PPO | Attending: Internal Medicine

## 2019-06-02 DIAGNOSIS — Z20822 Contact with and (suspected) exposure to covid-19: Secondary | ICD-10-CM

## 2019-06-03 LAB — NOVEL CORONAVIRUS, NAA: SARS-CoV-2, NAA: NOT DETECTED

## 2019-06-09 ENCOUNTER — Other Ambulatory Visit: Payer: Self-pay | Admitting: Family Medicine

## 2019-06-09 DIAGNOSIS — E039 Hypothyroidism, unspecified: Secondary | ICD-10-CM

## 2019-06-09 MED ORDER — LEVOTHYROXINE SODIUM 75 MCG PO TABS: 75.0000 ug | ORAL_TABLET | Freq: Every day | ORAL | 5 refills | Status: DC

## 2019-06-09 NOTE — Telephone Encounter (Signed)
[facility] faxed refill request for the following medications:  levothyroxine (SYNTHROID) 75 MCG tablet    Please advise.  Thanks, [employer]

## 2019-06-17 ENCOUNTER — Telehealth: Payer: Self-pay

## 2019-06-17 NOTE — Telephone Encounter (Signed)
Called and left a Voicemail with [facility] to change Brands for the levothyroxine tablet 30qty due to [facility] being on backorder.

## 2019-06-25 ENCOUNTER — Other Ambulatory Visit: Payer: Self-pay

## 2019-06-25 ENCOUNTER — Ambulatory Visit
Admission: RE | Admit: 2019-06-25 | Discharge: 2019-06-25 | Disposition: A | Discharge status: Home / Self Care | Payer: BC Managed Care – PPO | Source: Ambulatory Visit | Attending: Family Medicine | Admitting: Family Medicine

## 2019-06-25 DIAGNOSIS — Z1231 Encounter for screening mammogram for malignant neoplasm of breast: Secondary | ICD-10-CM | POA: Diagnosis present

## 2019-08-20 ENCOUNTER — Encounter: Payer: Self-pay | Admitting: Family Medicine

## 2019-09-03 NOTE — Progress Notes (Signed)
Patient: Tina Marshall, Female    DOB: 1955/07/17, 64 y.o.   MRN: [id number] Visit Date: 09/08/2019  Today's Provider: [name], MD   Chief Complaint  Patient presents with  . Annual Exam   Subjective:     Annual physical exam Tina Marshall is a 64 y.o. female who presents today for health maintenance and complete physical. She feels well. She reports exercising some. She reports she is sleeping well. 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  She is glad that school is starting back in person from Covid pandemic. She sees Fraser Din nurse midwife from [place], she sees Dr. Phillip Heal from dermatology. ----------------------------------------------------------  Colonoscopy: 04/08/2013 Mammogram: 06/25/2019 Pap: 02/02/2015- Normal  Review of Systems  Constitutional: Negative for activity change and fatigue.  HENT: Negative.   Eyes: Negative.   Respiratory: Negative for cough and shortness of breath.   Cardiovascular: Negative for chest pain, palpitations and leg swelling.  Gastrointestinal: Negative.   Endocrine: Negative for cold intolerance, heat intolerance, polyphagia and polyuria.  Musculoskeletal: Negative for arthralgias and joint swelling.  Skin: Negative.   Allergic/Immunologic: Negative for environmental allergies.  Neurological: Negative for dizziness, light-headedness and headaches.  Hematological: Negative.   Psychiatric/Behavioral: Negative for agitation, self-injury, sleep disturbance and suicidal ideas. The patient is not nervous/anxious.     Social History      She  reports that she has never smoked. She has never used smokeless tobacco. She reports current alcohol use. She reports that she does not use drugs.       Social History   Socioeconomic History  . Marital status: Married    Spouse name: Not on file  . Number of children: Not on file  . Years of education: Not on file  . Highest  education level: Not on file  Occupational History  . Not on file  Tobacco Use  . Smoking status: Never Smoker  . Smokeless tobacco: Never Used  Substance and Sexual Activity  . Alcohol use: Yes    Comment: occ  . Drug use: No  . Sexual activity: Not on file  Other Topics Concern  . Not on file  Social History Narrative  . Not on file   Social Determinants of Health   Financial Resource Strain:   . Difficulty of Paying Living Expenses:   Food Insecurity:   . Worried About Charity fundraiser in the Last Year:   . Arboriculturist in the Last Year:   Transportation Needs:   . Film/video editor (Medical):   Marland Kitchen Lack of Transportation (Non-Medical):   Physical Activity:   . Days of Exercise per Week:   . Minutes of Exercise per Session:   Stress:   . Feeling of Stress :   Social Connections:   . Frequency of Communication with Friends and Family:   . Frequency of Social Gatherings with Friends and Family:   . Attends Religious Services:   . Active Member of Clubs or Organizations:   . Attends Archivist Meetings:   Marland Kitchen Marital Status:     Past Medical History:  Diagnosis Date  . Graves disease   . Hypothyroidism      Patient Active Problem List   Diagnosis Date Noted  . Essential hypertension 09/19/2017  . Anxiety 09/19/2017  . Family history of breast cancer 02/07/2017  . 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 Affective disorder, major 11/08/2014  Past Surgical History:  Procedure Laterality Date  . CESAREAN SECTION      Family History        Family Status  Relation Name Status  . Mother  Alive  . Father  Alive  . Sister  Alive  . Brother  Alive  . MGM  Deceased  . MGF  Deceased  . PGM  Deceased  . PGF  Deceased  . Daughter  Alive  . Emelda Brothers  Deceased  . Cousin mat Alive  . Other  (Not Specified)        Her family history includes Anemia in her sister; Atrial fibrillation in her mother; Breast cancer  (age of onset: 64) in her paternal aunt; Breast cancer (age of onset: 8) in her cousin; Breast cancer (age of onset: 41) in her sister; Breast cancer (age of onset: 75) in her maternal grandmother; Dementia in her father; Heart attack in her maternal grandfather and paternal grandfather; Heart disease in her father; Hypertension in her brother, sister, and another family member; Pancreatic cancer in her paternal grandmother; Parkinson's disease in her father.      No Known Allergies   Current Outpatient Medications:  .  CALCIUM PO, Take 600 mg by mouth 2 (two) times daily., Disp: , Rfl:  .  Cranberry Fruit 475 MG CAPS, Take by mouth., Disp: , Rfl:  .  levothyroxine (SYNTHROID) 75 MCG tablet, Take 1 tablet (75 mcg total) by mouth daily., Disp: 30 tablet, Rfl: 5 .  metoprolol succinate (TOPROL-XL) 25 MG 24 hr tablet, TAKE 1 TABLET BY MOUTH ONCE DAILY TAKE  WITH  OR  IMMEDIATELY  FOLLOWING  A  MEAL, Disp: 90 tablet, Rfl: 3 .  Probiotic Product (PROBIOTIC ADVANCED PO), Take by mouth., Disp: , Rfl:  .  aspirin 81 MG EC tablet, Take by mouth., Disp: , Rfl:  .  MULTIPLE VITAMIN PO, Take by mouth., Disp: , Rfl:    Patient Care Team: Maple Hudson., MD as PCP - General (Family Medicine)    Objective:    Vitals: BP 137/85 (BP Location: Right Arm, Patient Position: Sitting, Cuff Size: Large)   Pulse 71   Temp (!) 96.9 F (36.1 C) (Other (Comment))   Resp 16   Ht 5\' 1"  (1.549 m)   Wt 136 lb (61.7 kg)   SpO2 97%   BMI 25.70 kg/m    Vitals:   09/08/19 0820  BP: 137/85  Pulse: 71  Resp: 16  Temp: (!) 96.9 F (36.1 C)  TempSrc: Other (Comment)  SpO2: 97%  Weight: 136 lb (61.7 kg)  Height: 5\' 1"  (1.549 m)     Physical Exam Vitals reviewed.  Constitutional:      Appearance: She is well-developed.  HENT:     Head: Normocephalic and atraumatic.     Right Ear: External ear normal.     Left Ear: External ear normal.     Nose: Nose normal.  Eyes:     General: No scleral  icterus.    Conjunctiva/sclera: Conjunctivae normal.  Neck:     Thyroid: No thyromegaly.  Cardiovascular:     Rate and Rhythm: Normal rate and regular rhythm.     Heart sounds: Normal heart sounds.  Pulmonary:     Effort: Pulmonary effort is normal.     Breath sounds: Normal breath sounds.  Abdominal:     Palpations: Abdomen is soft.  Musculoskeletal:     Right lower leg: No edema.     Left  lower leg: No edema.  Skin:    General: Skin is warm and dry.  Neurological:     General: No focal deficit present.     Mental Status: She is alert and oriented to person, place, and time.  Psychiatric:        Mood and Affect: Mood normal.        Behavior: Behavior normal.        Thought Content: Thought content normal.        Judgment: Judgment normal.      Depression Screen PHQ 2/9 Scores 09/08/2019 03/07/2019 02/20/2018 08/15/2017  PHQ - 2 Score 0 0 0 1  PHQ- 9 Score 0 0 - 10       Assessment & Plan:     Routine Health Maintenance and Physical Exam  Exercise Activities and Dietary recommendations Goals   None     Immunization History  Administered Date(s) Administered  . Influenza Inj Mdck Quad Pf 03/18/2017  . Influenza,inj,Quad PF,6+ Mos 03/12/2015  . Influenza,inj,quad, With Preservative 03/24/2016  . Influenza-Unspecified 02/27/2018  . PFIZER SARS-COV-2 Vaccination 09/02/2019  . Tdap 01/04/2012  . Zoster 12/07/2010  . Zoster Recombinat (Shingrix) 04/17/2018, 07/24/2018    Health Maintenance  Topic Date Due  . Hepatitis C Screening  Never done  . HIV Screening  Never done  . PAP SMEAR-Modifier  02/01/2018  . INFLUENZA VACCINE  01/11/2019  . MAMMOGRAM  06/24/2021  . TETANUS/TDAP  01/03/2022  . COLONOSCOPY  04/09/2023     Discussed health benefits of physical activity, and encouraged her to engage in regular exercise appropriate for her age and condition.    --------------------------------------------------------------------  1. Annual physical exam Well  woman exam per [facility]. - TSH - Lipid panel - CBC w/Diff/Platelet - Comprehensive Metabolic Panel (CMET)  2. Adult hypothyroidism  - TSH - Lipid panel - CBC w/Diff/Platelet - Comprehensive Metabolic Panel (CMET)  3. Essential hypertension  - TSH - Lipid panel - CBC w/Diff/Platelet - Comprehensive Metabolic Panel (CMET)   Follow up in one year for CPE.    I,[name],acting as a scribe for Megan Mans, MD.,have documented all relevant documentation on the behalf of Megan Mans, MD,as directed by  Megan Mans, MD while in the presence of Megan Mans, MD.    Megan Mans, MD  [facility]

## 2019-09-08 ENCOUNTER — Encounter: Payer: Self-pay | Admitting: Family Medicine

## 2019-09-08 ENCOUNTER — Ambulatory Visit (INDEPENDENT_AMBULATORY_CARE_PROVIDER_SITE_OTHER): Payer: BC Managed Care – PPO | Admitting: Family Medicine

## 2019-09-08 ENCOUNTER — Other Ambulatory Visit: Payer: Self-pay

## 2019-09-08 VITALS — BP 137/85 | HR 71 | Temp 96.9°F | Resp 16 | Ht 61.0 in | Wt 136.0 lb

## 2019-09-08 DIAGNOSIS — Z0000 Encounter for general adult medical examination without abnormal findings: Secondary | ICD-10-CM | POA: Diagnosis not present

## 2019-09-08 DIAGNOSIS — E039 Hypothyroidism, unspecified: Secondary | ICD-10-CM

## 2019-09-08 DIAGNOSIS — I10 Essential (primary) hypertension: Secondary | ICD-10-CM

## 2019-09-08 LAB — POCT URINALYSIS DIPSTICK
Appearance: NORMAL
Bilirubin, UA: NEGATIVE
Blood, UA: NEGATIVE
Glucose, UA: NEGATIVE
Ketones, UA: NEGATIVE
Leukocytes, UA: NEGATIVE
Nitrite, UA: NEGATIVE
Odor: NORMAL
Protein, UA: NEGATIVE
Spec Grav, UA: 1.02 (ref 1.010–1.025)
Urobilinogen, UA: 0.2 E.U./dL
pH, UA: 6 (ref 5.0–8.0)

## 2019-09-09 ENCOUNTER — Encounter: Payer: Self-pay | Admitting: Family Medicine

## 2019-09-09 LAB — COMPREHENSIVE METABOLIC PANEL
ALT: 23 IU/L (ref 0–32)
AST: 22 IU/L (ref 0–40)
Albumin/Globulin Ratio: 2 (ref 1.2–2.2)
Albumin: 4.5 g/dL (ref 3.8–4.8)
Alkaline Phosphatase: 79 IU/L (ref 39–117)
BUN/Creatinine Ratio: 13 (ref 12–28)
BUN: 9 mg/dL (ref 8–27)
Bilirubin Total: 0.3 mg/dL (ref 0.0–1.2)
CO2: 21 mmol/L (ref 20–29)
Calcium: 9.5 mg/dL (ref 8.7–10.3)
Chloride: 106 mmol/L (ref 96–106)
Creatinine, Ser: 0.69 mg/dL (ref 0.57–1.00)
GFR calc Af Amer: 107 mL/min/{1.73_m2} (ref >59)
GFR calc non Af Amer: 93 mL/min/{1.73_m2} (ref >59)
Globulin, Total: 2.3 g/dL (ref 1.5–4.5)
Glucose: 92 mg/dL (ref 65–99)
Potassium: 4.3 mmol/L (ref 3.5–5.2)
Sodium: 142 mmol/L (ref 134–144)
Total Protein: 6.8 g/dL (ref 6.0–8.5)

## 2019-09-09 LAB — CBC WITH DIFFERENTIAL/PLATELET
Basophils Absolute: 0.1 10*3/uL (ref 0.0–0.2)
Basos: 1 %
EOS (ABSOLUTE): 0.5 10*3/uL — ABNORMAL HIGH (ref 0.0–0.4)
Eos: 9 %
Hematocrit: 40.8 % (ref 34.0–46.6)
Hemoglobin: 13.9 g/dL (ref 11.1–15.9)
Immature Grans (Abs): 0 10*3/uL (ref 0.0–0.1)
Immature Granulocytes: 0 %
Lymphocytes Absolute: 1.7 10*3/uL (ref 0.7–3.1)
Lymphs: 34 %
MCH: 33.5 pg — ABNORMAL HIGH (ref 26.6–33.0)
MCHC: 34.1 g/dL (ref 31.5–35.7)
MCV: 98 fL — ABNORMAL HIGH (ref 79–97)
Monocytes Absolute: 0.4 10*3/uL (ref 0.1–0.9)
Monocytes: 8 %
Neutrophils Absolute: 2.3 10*3/uL (ref 1.4–7.0)
Neutrophils: 48 %
Platelets: 255 10*3/uL (ref 150–450)
RBC: 4.15 x10E6/uL (ref 3.77–5.28)
RDW: 12.6 % (ref 11.7–15.4)
WBC: 4.9 10*3/uL (ref 3.4–10.8)

## 2019-09-09 LAB — LIPID PANEL
Chol/HDL Ratio: 4.6 ratio — ABNORMAL HIGH (ref 0.0–4.4)
Cholesterol, Total: 158 mg/dL (ref 100–199)
HDL: 34 mg/dL — ABNORMAL LOW (ref >39)
LDL Chol Calc (NIH): 89 mg/dL (ref 0–99)
Triglycerides: 203 mg/dL — ABNORMAL HIGH (ref 0–149)
VLDL Cholesterol Cal: 35 mg/dL (ref 5–40)

## 2019-09-09 LAB — TSH: TSH: 3.47 u[IU]/mL (ref 0.450–4.500)

## 2019-09-10 ENCOUNTER — Telehealth: Payer: Self-pay

## 2019-09-10 NOTE — Telephone Encounter (Signed)
-----   Message from Maple Hudson., MD sent at 09/09/2019  4:49 PM EDT ----- Labs in normal range.  Work on diet and exercise as discussed to try to get triglycerides down.  No meds needed at this time.

## 2019-09-10 NOTE — Telephone Encounter (Signed)
Patient advised of lab results

## 2020-03-15 NOTE — Progress Notes (Signed)
Established patient visit   Patient: Tina Marshall   DOB: 09-15-1955   64 y.o. Female  MRN: [id number] Visit Date: 03/16/2020  Today's healthcare provider: Megan Mans, MD   Chief Complaint  Patient presents with  . Hypertension   Subjective    HPI  Blood pressures been up in the past couple of months and the patient feels different. Hypertension, follow-up  BP Readings from Last 3 Encounters:  03/16/20 (!) 149/85  09/08/19 137/85  03/04/19 136/78   Wt Readings from Last 3 Encounters:  03/16/20 133 lb (60.3 kg)  09/08/19 136 lb (61.7 kg)  03/04/19 134 lb (60.8 kg)     She was last seen for hypertension 6 months ago.  BP at that visit was 137/85. Management since that visit includes no changes.  She reports excellent compliance with treatment. She is not having side effects.  She is following a Low Sodium diet. She is exercising. She does not smoke.  Use of agents associated with hypertension: none.   Outside blood pressures are elevated at home especially in the last few weeks.  Symptoms: No chest pain No chest pressure  No palpitations No syncope  No dyspnea No orthopnea  No paroxysmal nocturnal dyspnea No lower extremity edema   Pertinent labs: Lab Results  Component Value Date   CHOL 158 09/08/2019   HDL 34 (L) 09/08/2019   LDLCALC 89 09/08/2019   TRIG 203 (H) 09/08/2019   CHOLHDL 4.6 (H) 09/08/2019   Lab Results  Component Value Date   NA 142 09/08/2019   K 4.3 09/08/2019   CREATININE 0.69 09/08/2019   GFRNONAA 93 09/08/2019   GFRAA 107 09/08/2019   GLUCOSE 92 09/08/2019     The 10-year ASCVD risk score Denman George DC Jr., et al., 2013) is: 9%   ---------------------------------------------------------------------------------------------------   Patient Active Problem List   Diagnosis Date Noted  . Essential hypertension 09/19/2017  . Anxiety 09/19/2017  . Family history of breast cancer 02/07/2017  . 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 Affective disorder, major 11/08/2014   Past Medical History:  Diagnosis Date  . Graves disease   . Hypothyroidism    Social History   Tobacco Use  . Smoking status: Never Smoker  . Smokeless tobacco: Never Used  Vaping Use  . Vaping Use: Never used  Substance Use Topics  . Alcohol use: Yes    Comment: occ  . Drug use: No   No Known Allergies   Medications: Outpatient Medications Prior to Visit  Medication Sig  . CALCIUM PO Take 600 mg by mouth 2 (two) times daily.  . Cranberry Fruit 475 MG CAPS Take by mouth.  . levothyroxine (SYNTHROID) 75 MCG tablet Take 1 tablet (75 mcg total) by mouth daily.  . metoprolol succinate (TOPROL-XL) 25 MG 24 hr tablet TAKE 1 TABLET BY MOUTH ONCE DAILY TAKE  WITH  OR  IMMEDIATELY  FOLLOWING  A  MEAL  . Probiotic Product (PROBIOTIC ADVANCED PO) Take by mouth.  Marland Kitchen aspirin 81 MG EC tablet Take by mouth.  . MULTIPLE VITAMIN PO Take by mouth.   No facility-administered medications prior to visit.    Review of Systems  Constitutional: Negative.  Negative for appetite change, chills, fatigue and fever.  Respiratory: Negative.  Negative for chest tightness and shortness of breath.   Cardiovascular: Negative for chest pain and palpitations.  Gastrointestinal: Negative.  Negative for abdominal pain, nausea and vomiting.  Neurological: Positive  for headaches. Negative for dizziness, weakness and light-headedness.    Last CBC Lab Results  Component Value Date   WBC 4.9 09/08/2019   HGB 13.9 09/08/2019   HCT 40.8 09/08/2019   MCV 98 (H) 09/08/2019   MCH 33.5 (H) 09/08/2019   RDW 12.6 09/08/2019   PLT 255 09/08/2019   Last metabolic panel Lab Results  Component Value Date   GLUCOSE 92 09/08/2019   NA 142 09/08/2019   K 4.3 09/08/2019   CL 106 09/08/2019   CO2 21 09/08/2019   BUN 9 09/08/2019   CREATININE 0.69 09/08/2019   GFRNONAA 93 09/08/2019   GFRAA 107 09/08/2019   CALCIUM 9.5 09/08/2019    PROT 6.8 09/08/2019   ALBUMIN 4.5 09/08/2019   LABGLOB 2.3 09/08/2019   AGRATIO 2.0 09/08/2019   BILITOT 0.3 09/08/2019   ALKPHOS 79 09/08/2019   AST 22 09/08/2019   ALT 23 09/08/2019   Last lipids Lab Results  Component Value Date   CHOL 158 09/08/2019   HDL 34 (L) 09/08/2019   LDLCALC 89 09/08/2019   TRIG 203 (H) 09/08/2019   CHOLHDL 4.6 (H) 09/08/2019   Last hemoglobin A1c No results found for: HGBA1C  Objective    BP (!) 149/85 (BP Location: Right Arm, Patient Position: Sitting, Cuff Size: Large)   Pulse 72   Temp 97.8 F (36.6 C) (Oral)   Wt 133 lb (60.3 kg)   BMI 25.13 kg/m    Physical Exam Vitals reviewed.  Constitutional:      Appearance: She is well-developed.  HENT:     Head: Normocephalic and atraumatic.     Right Ear: External ear normal.     Left Ear: External ear normal.     Nose: Nose normal.  Eyes:     General: No scleral icterus.    Conjunctiva/sclera: Conjunctivae normal.  Neck:     Thyroid: No thyromegaly.  Cardiovascular:     Rate and Rhythm: Normal rate and regular rhythm.     Heart sounds: Normal heart sounds.  Pulmonary:     Effort: Pulmonary effort is normal.     Breath sounds: Normal breath sounds.  Abdominal:     Palpations: Abdomen is soft.  Musculoskeletal:     Right lower leg: No edema.     Left lower leg: No edema.  Skin:    General: Skin is warm and dry.  Neurological:     General: No focal deficit present.     Mental Status: She is alert and oriented to person, place, and time.  Psychiatric:        Mood and Affect: Mood normal.        Behavior: Behavior normal.        Thought Content: Thought content normal.        Judgment: Judgment normal.       No results found for any visits on 03/16/20.  Assessment & Plan     1. Essential hypertension Add chlorthalidone 25 mg every morning and follow-up in 1 to 2 months.  Check at least renal panel then. Move Toprol to evening dose. 2. Adult hypothyroidism   3.  Anxiety Parents in failing health. 4.  Depression Presently in remission on no medication.  No follow-ups on file.      I, Megan Mans, MD, have reviewed all documentation for this visit. The documentation on 03/17/20 for the exam, diagnosis, procedures, and orders are all accurate and complete.    Megan Mans, MD  [place] [phone] (phone) [phone] (fax)  [place]

## 2020-03-16 ENCOUNTER — Ambulatory Visit: Payer: BC Managed Care – PPO | Admitting: Family Medicine

## 2020-03-16 ENCOUNTER — Other Ambulatory Visit: Payer: Self-pay

## 2020-03-16 ENCOUNTER — Encounter: Payer: Self-pay | Admitting: Family Medicine

## 2020-03-16 VITALS — BP 149/85 | HR 72 | Temp 97.8°F | Wt 133.0 lb

## 2020-03-16 DIAGNOSIS — F419 Anxiety disorder, unspecified: Secondary | ICD-10-CM

## 2020-03-16 DIAGNOSIS — I10 Essential (primary) hypertension: Secondary | ICD-10-CM

## 2020-03-16 DIAGNOSIS — F325 Major depressive disorder, single episode, in full remission: Secondary | ICD-10-CM

## 2020-03-16 DIAGNOSIS — E039 Hypothyroidism, unspecified: Secondary | ICD-10-CM | POA: Diagnosis not present

## 2020-03-16 MED ORDER — CHLORTHALIDONE 25 MG PO TABS: 25.0000 mg | ORAL_TABLET | Freq: Every day | ORAL | 1 refills | Status: DC

## 2020-03-16 NOTE — Patient Instructions (Addendum)
Take Chlorthalidone 25mg  in the mornings, and Metoprolol in the evenings.

## 2020-03-17 DIAGNOSIS — F325 Major depressive disorder, single episode, in full remission: Secondary | ICD-10-CM | POA: Insufficient documentation

## 2020-03-21 ENCOUNTER — Other Ambulatory Visit: Payer: Self-pay | Admitting: Family Medicine

## 2020-03-21 DIAGNOSIS — E039 Hypothyroidism, unspecified: Secondary | ICD-10-CM

## 2020-04-14 ENCOUNTER — Ambulatory Visit: Payer: Self-pay | Admitting: *Deleted

## 2020-04-14 NOTE — Telephone Encounter (Signed)
Pt tested positive for covid on Sunday. Pt wants to know if the dr wants her to be doing or should do Pt wants to know if she is candidate for the infusion   Patient reports she has family dynamics with recent death of father and covid + diagnosis of elderly mother that recieved infusion for covid. Patient reports symptoms noted on Sunday 04/11/20 no smell and cough. Patient tested positive on Sunday and her husband has tested negative.denies fever, SOB, sore throat.  Isolation precautions reviewed and quarantine requirements reviewed.  Will submit patient information to [facility] infusion clinic. Care advise given. Patient verbalized understanding of care advise and to call back or go to UC or ED if symptoms worsen. Please advise for further instructions for patient.   Reason for Disposition . [1] COVID-19 diagnosed by positive lab test AND [2] mild symptoms (e.g., cough, fever, others) AND [3] no complications or SOB  Answer Assessment - Initial Assessment Questions 1. COVID-19 DIAGNOSIS: "Who made your Coronavirus (COVID-19) diagnosis?" "Was it confirmed by a positive lab test?" If not diagnosed by a HCP, ask "Are there lots of cases (community spread) where you live?" (See public health department website, if unsure)     [facility] rapid test  2. COVID-19 EXPOSURE: "Was there any known exposure to COVID before the symptoms began?" CDC Definition of close contact: within 6 feet (2 meters) for a total of 15 minutes or more over a 24-hour period.      None that she is aware of  3. ONSET: "When did the COVID-19 symptoms start?"      10 /31/21 4. WORST SYMPTOM: "What is your worst symptom?" (e.g., cough, fever, shortness of breath, muscle aches)     No smell, cough 5. COUGH: "Do you have a cough?" If Yes, ask: "How bad is the cough?"       Yes no bad. 6. FEVER: "Do you have a fever?" If Yes, ask: "What is your temperature, how was it measured, and when did it start?"     no 7. RESPIRATORY STATUS: "Describe  your breathing?" (e.g., shortness of breath, wheezing, unable to speak)      no 8. BETTER-SAME-WORSE: "Are you getting better, staying the same or getting worse compared to yesterday?"  If getting worse, ask, "In what way?"     Same  9. HIGH RISK DISEASE: "Do you have any chronic medical problems?" (e.g., asthma, heart or lung disease, weak immune system, obesity, etc.)     HTN, thyroid issues  10. PREGNANCY: "Is there any chance you are pregnant?" "When was your last menstrual period?"       na 11. OTHER SYMPTOMS: "Do you have any other symptoms?"  (e.g., chills, fatigue, headache, loss of smell or taste, muscle pain, sore throat; new loss of smell or taste especially support the diagnosis of COVID-19)       Headache, no smell, cough  Protocols used: CORONAVIRUS (COVID-19) DIAGNOSED OR SUSPECTED-A-AH

## 2020-04-15 ENCOUNTER — Telehealth: Payer: Self-pay | Admitting: Infectious Diseases

## 2020-04-15 NOTE — Telephone Encounter (Signed)
Called to Discuss with patient about Covid symptoms and the use of the monoclonal antibody infusion for those with mild to moderate Covid symptoms and at a high risk of hospitalization.     Pt appears to qualify for this infusion due to co-morbid conditions and/or a member of an at-risk group in accordance with the FDA Emergency Use Authorization.   Feeling fine but just cannot smell. Her mother was diagnosed on Sunday (+) she noticed she was not able to smell at that time. No other symptoms at this time.  She has been fully vaccinated against COVID. Just buried her father last saturday.   Qualifies with HTN history.  She would like to scheduled at [facility] - will forward referral.    Rexene Alberts, MSN, NP-C [facility]  [email or web address] Pager: [phone] Office: [phone] [facility] Main Line: [phone]

## 2020-04-15 NOTE — Telephone Encounter (Signed)
Her name has been submitted to the infusion clinic.  She should hear from them today

## 2020-04-15 NOTE — Telephone Encounter (Signed)
Advised patient as below.  

## 2020-04-27 ENCOUNTER — Encounter: Payer: Self-pay | Admitting: Family Medicine

## 2020-04-27 ENCOUNTER — Other Ambulatory Visit: Payer: Self-pay

## 2020-04-27 ENCOUNTER — Ambulatory Visit: Payer: BC Managed Care – PPO | Admitting: Family Medicine

## 2020-04-27 VITALS — BP 139/87 | HR 94 | Temp 98.3°F | Resp 16 | Ht 61.0 in | Wt 132.0 lb

## 2020-04-27 DIAGNOSIS — F325 Major depressive disorder, single episode, in full remission: Secondary | ICD-10-CM

## 2020-04-27 DIAGNOSIS — E039 Hypothyroidism, unspecified: Secondary | ICD-10-CM

## 2020-04-27 DIAGNOSIS — F419 Anxiety disorder, unspecified: Secondary | ICD-10-CM | POA: Diagnosis not present

## 2020-04-27 DIAGNOSIS — I10 Essential (primary) hypertension: Secondary | ICD-10-CM

## 2020-04-27 NOTE — Progress Notes (Signed)
I,[name],acting as a scribe for Megan Mans, MD.,have documented all relevant documentation on the behalf of Megan Mans, MD,as directed by  Megan Mans, MD while in the presence of Megan Mans, MD.   Established patient visit   Patient: Tina Marshall   DOB: Nov 28, 1955   64 y.o. Female  MRN: [id number] Visit Date: 04/27/2020  Today's healthcare provider: Megan Mans, MD   Chief Complaint  Patient presents with  . Anxiety  . Follow-up  . Hypertension   Subjective    HPI  Hypertension, follow-up  BP Readings from Last 3 Encounters:  04/27/20 139/87  03/16/20 (!) 149/85  09/08/19 137/85   Wt Readings from Last 3 Encounters:  04/27/20 132 lb (59.9 kg)  03/16/20 133 lb (60.3 kg)  09/08/19 136 lb (61.7 kg)     She was last seen for hypertension 1 months ago.  BP at that visit was 149/85. Management since that visit includes; Added chlorthalidone 25 mg every morning and follow-up in 1 to 2 months.  Check at least renal panel then. Move Toprol to evening dose. She reports good compliance with treatment. She is not having side effects. none She is exercising. She is adherent to low salt diet.   Outside blood pressures are not checking.  She does not smoke.  Use of agents associated with hypertension: none.   --------------------------------------------------------------------  Anxiety, Follow-up  She was last seen for anxiety 1 months ago. Changes made at last visit include; Parents in failing health.   She reports good compliance with treatment. She reports good tolerance of treatment. She is not having side effects. none  She feels her anxiety is moderate and Worse since last visit.  Symptoms: No chest pain No difficulty concentrating  No dizziness Yes fatigue  No feelings of losing control No insomnia  Yes irritable No palpitations  No panic attacks No racing thoughts  No shortness of breath No sweating  No  tremors/shakes    GAD-7 Results No flowsheet data found.  PHQ-9 Scores PHQ9 SCORE ONLY 03/16/2020 09/08/2019 03/07/2019  PHQ-9 Total Score 0 0 0    --------------------------------------------------------------------      Medications: Outpatient Medications Prior to Visit  Medication Sig  . CALCIUM PO Take 600 mg by mouth 2 (two) times daily.  . chlorthalidone (HYGROTON) 25 MG tablet Take 1 tablet (25 mg total) by mouth daily.  . Cranberry Fruit 475 MG CAPS Take by mouth.  Janann August 75 MCG tablet Take 1 tablet by mouth once daily  . metoprolol succinate (TOPROL-XL) 25 MG 24 hr tablet TAKE 1 TABLET BY MOUTH ONCE DAILY TAKE  WITH  OR  IMMEDIATELY  FOLLOWING  A  MEAL  . Probiotic Product (PROBIOTIC ADVANCED PO) Take by mouth.  Marland Kitchen aspirin 81 MG EC tablet Take by mouth.  . MULTIPLE VITAMIN PO Take by mouth.   No facility-administered medications prior to visit.    Review of Systems  Constitutional: Negative for appetite change, chills, fatigue and fever.  Respiratory: Negative for chest tightness and shortness of breath.   Cardiovascular: Negative for chest pain and palpitations.  Gastrointestinal: Negative for abdominal pain, nausea and vomiting.  Neurological: Negative for dizziness and weakness.       Objective    BP 139/87 (BP Location: Left Arm, Patient Position: Sitting, Cuff Size: Normal)   Pulse 94   Temp 98.3 F (36.8 C) (Oral)   Resp 16   Ht 5\' 1"  (1.549 m)  Wt 132 lb (59.9 kg)   SpO2 99%   BMI 24.94 kg/m  BP Readings from Last 3 Encounters:  04/27/20 139/87  03/16/20 (!) 149/85  09/08/19 137/85   Wt Readings from Last 3 Encounters:  04/27/20 132 lb (59.9 kg)  03/16/20 133 lb (60.3 kg)  09/08/19 136 lb (61.7 kg)      Physical Exam Vitals reviewed.  Constitutional:      Appearance: She is well-developed.  HENT:     Head: Normocephalic and atraumatic.     Right Ear: External ear normal.     Left Ear: External ear normal.     Nose: Nose normal.    Eyes:     General: No scleral icterus.    Conjunctiva/sclera: Conjunctivae normal.  Neck:     Thyroid: No thyromegaly.  Cardiovascular:     Rate and Rhythm: Normal rate and regular rhythm.     Heart sounds: Normal heart sounds.  Pulmonary:     Effort: Pulmonary effort is normal.     Breath sounds: Normal breath sounds.  Abdominal:     Palpations: Abdomen is soft.  Musculoskeletal:     Right lower leg: No edema.     Left lower leg: No edema.  Skin:    General: Skin is warm and dry.  Neurological:     General: No focal deficit present.     Mental Status: She is alert and oriented to person, place, and time.  Psychiatric:        Mood and Affect: Mood normal.        Behavior: Behavior normal.        Thought Content: Thought content normal.        Judgment: Judgment normal.       No results found for any visits on 04/27/20.  Assessment & Plan     1. Essential hypertension Controlled.  2. Adult hypothyroidism   3. Major depressive disorder with single episode, in full remission (HCC) Stable  4. Anxiety    No follow-ups on file.         Sloka Volante Wendelyn Breslow, MD  [facility] [phone] (phone) [phone] (fax)  [facility]

## 2020-04-29 ENCOUNTER — Other Ambulatory Visit: Payer: Self-pay | Admitting: Family Medicine

## 2020-04-29 DIAGNOSIS — I10 Essential (primary) hypertension: Secondary | ICD-10-CM

## 2020-04-29 NOTE — Telephone Encounter (Signed)
   Notes to clinic:  Patient is requesting a 90 day  Review for change   Requested Prescriptions  Pending Prescriptions Disp Refills   chlorthalidone (HYGROTON) 25 MG tablet 30 tablet 1    Sig: Take 1 tablet (25 mg total) by mouth daily.      Cardiovascular: Diuretics - Thiazide Passed - 04/29/2020  9:04 AM      Passed - Ca in normal range and within 360 days    Calcium  Date Value Ref Range Status  09/08/2019 9.5 8.7 - 10.3 mg/dL Final          Passed - Cr in normal range and within 360 days    Creatinine, Ser  Date Value Ref Range Status  09/08/2019 0.69 0.57 - 1.00 mg/dL Final          Passed - K in normal range and within 360 days    Potassium  Date Value Ref Range Status  09/08/2019 4.3 3.5 - 5.2 mmol/L Final          Passed - Na in normal range and within 360 days    Sodium  Date Value Ref Range Status  09/08/2019 142 134 - 144 mmol/L Final          Passed - Last BP in normal range    BP Readings from Last 1 Encounters:  04/27/20 139/87          Passed - Valid encounter within last 6 months    Recent Outpatient Visits           2 days ago    [facility] Maple Hudson., MD   1 month ago Essential hypertension   [facility] Maple Hudson., MD   7 months ago Annual physical exam   [facility] Maple Hudson., MD   1 year ago Adult hypothyroidism   [facility] Maple Hudson., MD   2 years ago Annual physical exam   [facility] Maple Hudson., MD       Future Appointments             In 4 months Maple Hudson., MD [facility]

## 2020-04-29 NOTE — Telephone Encounter (Signed)
Medication Refill - Medication: chlorthalidone   Has the patient contacted their pharmacy? Yes.   Pt states that she is needing to have a 90  Day refill instead of 30 days. Please advise.  (Agent: If no, request that the patient contact the pharmacy for the refill.) (Agent: If yes, when and what did the pharmacy advise?)  Preferred Pharmacy (with phone number or street name):  [facility] [address] [place] - [address]  [address] [place] [address]  Phone: [phone] Fax: [phone]  Hours: Not open 24 hours     Agent: Please be advised that RX refills may take up to 3 business days. We ask that you follow-up with your pharmacy.

## 2020-04-30 MED ORDER — CHLORTHALIDONE 25 MG PO TABS: 25.0000 mg | ORAL_TABLET | Freq: Every day | ORAL | 3 refills | Status: DC

## 2020-05-25 ENCOUNTER — Other Ambulatory Visit: Payer: Self-pay | Admitting: Family Medicine

## 2020-05-25 DIAGNOSIS — Z1231 Encounter for screening mammogram for malignant neoplasm of breast: Secondary | ICD-10-CM

## 2020-06-16 ENCOUNTER — Other Ambulatory Visit: Payer: Self-pay | Admitting: Family Medicine

## 2020-06-16 DIAGNOSIS — E039 Hypothyroidism, unspecified: Secondary | ICD-10-CM

## 2020-06-30 ENCOUNTER — Other Ambulatory Visit: Payer: Self-pay

## 2020-06-30 ENCOUNTER — Ambulatory Visit
Admission: RE | Admit: 2020-06-30 | Discharge: 2020-06-30 | Disposition: A | Discharge status: Home / Self Care | Payer: BC Managed Care – PPO | Source: Ambulatory Visit | Attending: Family Medicine | Admitting: Family Medicine

## 2020-06-30 DIAGNOSIS — Z1231 Encounter for screening mammogram for malignant neoplasm of breast: Secondary | ICD-10-CM | POA: Insufficient documentation

## 2020-07-27 ENCOUNTER — Other Ambulatory Visit: Payer: Self-pay | Admitting: Family Medicine

## 2020-07-27 DIAGNOSIS — I10 Essential (primary) hypertension: Secondary | ICD-10-CM

## 2020-07-30 ENCOUNTER — Other Ambulatory Visit: Payer: Self-pay | Admitting: Family Medicine

## 2020-07-30 DIAGNOSIS — I10 Essential (primary) hypertension: Secondary | ICD-10-CM

## 2020-09-06 NOTE — Progress Notes (Signed)
I,[name],acting as a scribe for Megan Mans, MD.,have documented all relevant documentation on the behalf of Megan Mans, MD,as directed by  Megan Mans, MD while in the presence of Megan Mans, MD.   Complete physical exam   Patient: Tina Marshall   DOB: 1956/06/02   65 y.o. Female  MRN: [id number] Visit Date: 09/07/2020  Today's healthcare provider: Megan Mans, MD   Chief Complaint  Patient presents with  . Annual Exam   Subjective    Tina Marshall is a 65 y.o. female who presents today for a complete physical exam.  She reports consuming a general and almond milk diet. Home exercise routine includes walking. She generally feels well. She reports sleeping well. She does not have additional problems to discuss today.  Patient is doing fairly well.  Her father died last year and her mother has round-the-clock care but because of this she has less stress.  Last pap 01/2015 w/ Dr. Luella Cook. WNL HPV negative.   Past Medical History:  Diagnosis Date  . Graves disease   . Hypothyroidism    Past Surgical History:  Procedure Laterality Date  . CESAREAN SECTION     Social History   Socioeconomic History  . Marital status: Married    Spouse name: Not on file  . Number of children: Not on file  . Years of education: Not on file  . Highest education level: Not on file  Occupational History  . Not on file  Tobacco Use  . Smoking status: Never Smoker  . Smokeless tobacco: Never Used  Vaping Use  . Vaping Use: Never used  Substance and Sexual Activity  . Alcohol use: Yes    Comment: occ  . Drug use: No  . Sexual activity: Not on file  Other Topics Concern  . Not on file  Social History Narrative  . Not on file   Social Determinants of Health   Financial Resource Strain: Not on file  Food Insecurity: Not on file  Transportation Needs: Not on file  Physical Activity: Not on file  Stress: Not on file  Social Connections:  Not on file  Intimate Partner Violence: Not on file   Family Status  Relation Name Status  . Mother  Alive  . Father  Alive  . Sister  Alive  . Brother  Alive  . MGM  Deceased  . MGF  Deceased  . PGM  Deceased  . PGF  Deceased  . Daughter  Alive  . Emelda Brothers  Deceased  . Cousin mat Alive  . Other  (Not Specified)   Family History  Problem Relation Age of Onset  . Atrial fibrillation Mother   . Heart disease Father   . Parkinson's disease Father   . Dementia Father   . Anemia Sister   . Hypertension Sister   . Breast cancer Sister 28  . Hypertension Brother   . Breast cancer Maternal Grandmother 53  . Heart attack Maternal Grandfather   . Pancreatic cancer Paternal Grandmother   . Heart attack Paternal Grandfather   . Breast cancer Paternal Aunt 34  . Breast cancer Cousin 40  . Hypertension Other    No Known Allergies  Patient Care Team: Maple Hudson., MD as PCP - General (Family Medicine)   Medications: Outpatient Medications Prior to Visit  Medication Sig  . CALCIUM PO Take 600 mg by mouth 2 (two) times daily.  . chlorthalidone (HYGROTON) 25  MG tablet Take 1 tablet (25 mg total) by mouth daily.  . Cranberry Fruit 475 MG CAPS Take by mouth.  Janann August 75 MCG tablet Take 1 tablet by mouth once daily  . metoprolol succinate (TOPROL-XL) 25 MG 24 hr tablet TAKE 1 TABLET BY MOUTH ONCE DAILY (TAKE  WITH  OR  IMMEDIATELY  FOLLOWING  A  MEAL)  . Probiotic Product (PROBIOTIC ADVANCED PO) Take by mouth.  Marland Kitchen aspirin 81 MG EC tablet Take by mouth.  . MULTIPLE VITAMIN PO Take by mouth.   No facility-administered medications prior to visit.    Review of Systems  All other systems reviewed and are negative.      Objective    BP 131/83 (BP Location: Right Arm, Patient Position: Sitting, Cuff Size: Normal)   Pulse 63   Temp 98 F (36.7 C) (Oral)   Resp 16   Ht 5\' 1"  (1.549 m)   Wt 134 lb (60.8 kg)   SpO2 98%   BMI 25.32 kg/m  BP Readings from Last 3  Encounters:  09/07/20 131/83  04/27/20 139/87  03/16/20 (!) 149/85   Wt Readings from Last 3 Encounters:  09/07/20 134 lb (60.8 kg)  04/27/20 132 lb (59.9 kg)  03/16/20 133 lb (60.3 kg)      Physical Exam Vitals reviewed.  Constitutional:      Appearance: She is well-developed.  HENT:     Head: Normocephalic and atraumatic.     Right Ear: External ear normal.     Left Ear: External ear normal.     Nose: Nose normal.  Eyes:     General: No scleral icterus.    Conjunctiva/sclera: Conjunctivae normal.  Neck:     Thyroid: No thyromegaly.  Cardiovascular:     Rate and Rhythm: Normal rate and regular rhythm.     Heart sounds: Normal heart sounds.  Pulmonary:     Effort: Pulmonary effort is normal.     Breath sounds: Normal breath sounds.  Abdominal:     Palpations: Abdomen is soft.  Musculoskeletal:     Right lower leg: No edema.     Left lower leg: No edema.  Skin:    General: Skin is warm and dry.  Neurological:     General: No focal deficit present.     Mental Status: She is alert and oriented to person, place, and time.  Psychiatric:        Mood and Affect: Mood normal.        Behavior: Behavior normal.        Thought Content: Thought content normal.        Judgment: Judgment normal.       Last depression screening scores PHQ 2/9 Scores 09/07/2020 03/16/2020 09/08/2019  PHQ - 2 Score 0 0 0  PHQ- 9 Score 0 0 0   Last fall risk screening Fall Risk  03/16/2020  Falls in the past year? 0  Number falls in past yr: 0  Injury with Fall? 0  Risk for fall due to : No Fall Risks  Follow up Falls evaluation completed   Last Audit-C alcohol use screening Alcohol Use Disorder Test (AUDIT) 09/07/2020  1. How often do you have a drink containing alcohol? 1  2. How many drinks containing alcohol do you have on a typical day when you are drinking? 0  3. How often do you have six or more drinks on one occasion? 0  AUDIT-C Score 1  Alcohol Brief Interventions/Follow-up  AUDIT  Score <7 follow-up not indicated   A score of 3 or more in women, and 4 or more in men indicates increased risk for alcohol abuse, EXCEPT if all of the points are from question 1   No results found for any visits on 09/07/20.  Assessment & Plan    Routine Health Maintenance and Physical Exam  Exercise Activities and Dietary recommendations Goals   None     Immunization History  Administered Date(s) Administered  . Influenza Inj Mdck Quad Pf 03/18/2017  . Influenza,inj,Quad PF,6+ Mos 03/12/2015  . Influenza,inj,quad, With Preservative 03/24/2016  . Influenza-Unspecified 02/27/2018, 03/09/2020  . PFIZER(Purple Top)SARS-COV-2 Vaccination 09/02/2019, 09/23/2019  . Tdap 01/04/2012  . Zoster 12/07/2010  . Zoster Recombinat (Shingrix) 04/17/2018, 07/24/2018    Health Maintenance  Topic Date Due  . Hepatitis C Screening  Never done  . HIV Screening  Never done  . PAP SMEAR-Modifier  02/01/2018  . COVID-19 Vaccine (3 - Booster for Pfizer series) 03/24/2020  . TETANUS/TDAP  01/03/2022  . MAMMOGRAM  06/30/2022  . COLONOSCOPY (Pts 45-19yrs Insurance coverage will need to be confirmed)  04/09/2023  . INFLUENZA VACCINE  Completed  . HPV VACCINES  Aged Out    Discussed health benefits of physical activity, and encouraged her to engage in regular exercise appropriate for her age and condition.  1. Annual physical exam Well woman exam in May per [facility].  2. Essential hypertension Good control. - CBC with Differential/Platelet - Comprehensive metabolic panel - Lipid panel  3. Adult hypothyroidism  - TSH  4. AK (actinic keratosis)  - Ambulatory referral to Dermatology  5. Screening for colon cancer Last colonoscopy 2014. Per  [facility] - Ambulatory referral to Gastroenterology   No follow-ups on file.     I, Megan Mans, MD, have reviewed all documentation for this visit. The documentation on 09/19/20 for the exam, diagnosis, procedures, and orders are all  accurate and complete.    Mirella Gueye Wendelyn Breslow, MD  [facility] [phone] (phone) [phone] (fax)  [facility]

## 2020-09-07 ENCOUNTER — Ambulatory Visit (INDEPENDENT_AMBULATORY_CARE_PROVIDER_SITE_OTHER): Payer: BC Managed Care – PPO | Admitting: Family Medicine

## 2020-09-07 ENCOUNTER — Other Ambulatory Visit: Payer: Self-pay

## 2020-09-07 ENCOUNTER — Encounter: Payer: Self-pay | Admitting: Family Medicine

## 2020-09-07 VITALS — BP 131/83 | HR 63 | Temp 98.0°F | Resp 16 | Ht 61.0 in | Wt 134.0 lb

## 2020-09-07 DIAGNOSIS — E039 Hypothyroidism, unspecified: Secondary | ICD-10-CM

## 2020-09-07 DIAGNOSIS — L570 Actinic keratosis: Secondary | ICD-10-CM

## 2020-09-07 DIAGNOSIS — Z1211 Encounter for screening for malignant neoplasm of colon: Secondary | ICD-10-CM

## 2020-09-07 DIAGNOSIS — I10 Essential (primary) hypertension: Secondary | ICD-10-CM | POA: Diagnosis not present

## 2020-09-07 DIAGNOSIS — Z0000 Encounter for general adult medical examination without abnormal findings: Secondary | ICD-10-CM | POA: Diagnosis not present

## 2020-09-08 ENCOUNTER — Telehealth: Payer: Self-pay | Admitting: Family Medicine

## 2020-09-08 ENCOUNTER — Encounter: Payer: Self-pay | Admitting: Family Medicine

## 2020-09-08 ENCOUNTER — Other Ambulatory Visit: Payer: Self-pay | Admitting: *Deleted

## 2020-09-08 DIAGNOSIS — I10 Essential (primary) hypertension: Secondary | ICD-10-CM

## 2020-09-08 DIAGNOSIS — E039 Hypothyroidism, unspecified: Secondary | ICD-10-CM

## 2020-09-08 LAB — COMPREHENSIVE METABOLIC PANEL
ALT: 32 IU/L (ref 0–32)
AST: 34 IU/L (ref 0–40)
Albumin/Globulin Ratio: 1.9 (ref 1.2–2.2)
Albumin: 4.7 g/dL (ref 3.8–4.8)
Alkaline Phosphatase: 82 IU/L (ref 44–121)
BUN/Creatinine Ratio: 14 (ref 12–28)
BUN: 13 mg/dL (ref 8–27)
Bilirubin Total: 0.5 mg/dL (ref 0.0–1.2)
CO2: 26 mmol/L (ref 20–29)
Calcium: 9.6 mg/dL (ref 8.7–10.3)
Chloride: 100 mmol/L (ref 96–106)
Creatinine, Ser: 0.96 mg/dL (ref 0.57–1.00)
Globulin, Total: 2.5 g/dL (ref 1.5–4.5)
Glucose: 99 mg/dL (ref 65–99)
Potassium: 3.9 mmol/L (ref 3.5–5.2)
Sodium: 141 mmol/L (ref 134–144)
Total Protein: 7.2 g/dL (ref 6.0–8.5)
eGFR: 66 mL/min/{1.73_m2} (ref >59)

## 2020-09-08 LAB — CBC WITH DIFFERENTIAL/PLATELET
Basophils Absolute: 0.1 10*3/uL (ref 0.0–0.2)
Basos: 1 %
EOS (ABSOLUTE): 0.3 10*3/uL (ref 0.0–0.4)
Eos: 5 %
Hematocrit: 42.8 % (ref 34.0–46.6)
Hemoglobin: 14.9 g/dL (ref 11.1–15.9)
Immature Grans (Abs): 0 10*3/uL (ref 0.0–0.1)
Immature Granulocytes: 0 %
Lymphocytes Absolute: 2.4 10*3/uL (ref 0.7–3.1)
Lymphs: 39 %
MCH: 34.4 pg — ABNORMAL HIGH (ref 26.6–33.0)
MCHC: 34.8 g/dL (ref 31.5–35.7)
MCV: 99 fL — ABNORMAL HIGH (ref 79–97)
Monocytes Absolute: 0.6 10*3/uL (ref 0.1–0.9)
Monocytes: 9 %
Neutrophils Absolute: 2.8 10*3/uL (ref 1.4–7.0)
Neutrophils: 46 %
Platelets: 291 10*3/uL (ref 150–450)
RBC: 4.33 x10E6/uL (ref 3.77–5.28)
RDW: 12.5 % (ref 11.7–15.4)
WBC: 6.1 10*3/uL (ref 3.4–10.8)

## 2020-09-08 LAB — LIPID PANEL
Chol/HDL Ratio: 5.6 ratio — ABNORMAL HIGH (ref 0.0–4.4)
Cholesterol, Total: 212 mg/dL — ABNORMAL HIGH (ref 100–199)
HDL: 38 mg/dL — ABNORMAL LOW (ref >39)
LDL Chol Calc (NIH): 136 mg/dL — ABNORMAL HIGH (ref 0–99)
Triglycerides: 210 mg/dL — ABNORMAL HIGH (ref 0–149)
VLDL Cholesterol Cal: 38 mg/dL (ref 5–40)

## 2020-09-08 LAB — TSH: TSH: 9.16 u[IU]/mL — ABNORMAL HIGH (ref 0.450–4.500)

## 2020-09-08 MED ORDER — LEVOTHYROXINE SODIUM 88 MCG PO TABS: 88.0000 ug | ORAL_TABLET | Freq: Every day | ORAL | 1 refills | Status: DC

## 2020-09-08 NOTE — Telephone Encounter (Signed)
Patient would like the nurse to call her to go over her lab results.  She stated she had some questions about her TSH, MCV and MCH.  Please call patient to discuss at [phone]

## 2020-09-08 NOTE — Telephone Encounter (Signed)
Patient was notified of results.  

## 2020-09-14 ENCOUNTER — Telehealth: Payer: Self-pay

## 2020-09-14 NOTE — Telephone Encounter (Signed)
Copied from CRM [id number]. Topic: Appointment Scheduling - Scheduling Inquiry for Clinic >> Sep 14, 2020  3:15 PM Tina Marshall wrote: Patient was seen by PCP on 09/07/2020 for a physical and was under the impression was not due for a colon screening and she is confused why a referral was placed. Patient would like to know if she is due please advise

## 2020-09-15 NOTE — Telephone Encounter (Signed)
Returned call to patient. Advised last colonoscopy was 04/08/2013 with Dr. Servando Snare. Next is due 03/2023. Patient would like referral canceled.

## 2020-09-28 ENCOUNTER — Telehealth: Payer: Self-pay

## 2020-09-28 NOTE — Telephone Encounter (Signed)
Copied from CRM [id number]. Topic: General - Other >> Sep 28, 2020  3:37 PM Laural Benes, [place] C wrote: Reason for CRM: pt called in for assistance. Pt says that she received a call from GI. Pt says that she is unsure of why? Pt says that she is not due her colonoscopy until 2024.   Please assist further.

## 2020-09-30 NOTE — Telephone Encounter (Signed)
Patient requesting to have referral canceled. Please advise.

## 2020-10-11 NOTE — Progress Notes (Signed)
PCP: Maple Hudson., MD   Chief Complaint  Patient presents with  . Gynecologic Exam    No concerns    HPI:      [name] is a 65 y.o. G2P2002 who LMP was No LMP recorded. Patient is postmenopausal., presents today for her NP> 3 yrs annual examination.  Her menses are absent due to menopause. She does not have PMB. She does not have vasomotor sx.  Sex activity: single partner, contraception - post menopausal status. She does not have vaginal dryness. Hx of episodic ext vaginal itching, without increased d/c, odor. Sx intermittent, sometimes worse after exercise. Has damp underwear due to urine leakage/SUI.  Has seen derm for a eczematous type rash on the palmar surface of bilat hands.  Last Pap: February 02, 2015  Results were: no abnormalities /neg HPV DNA.  Hx of STDs: none  Last mammogram: 06/30/20  Results were: normal--routine follow-up in 12 months There is a FH of breast cancer in her MGM, sister, mat cousin, and pat aunt and pancreatic cancer in her PGM, genetic testing hasn't been done by pt. Pt states affected sister did some type of testing and her results were inconclusive (at [facility]). Pt declined MyRisk testing at last appt. There is no FH of ovarian cancer. The patient does do self-breast exams.  Colonoscopy: colonoscopy ~8 years ago without abnormalities.  Repeat due after 10 years.   Tobacco use: The patient denies current or previous tobacco use. Alcohol use: none  No drug use Exercise: moderately active  Has issues with SUI. Does kegels without relief. Drinks 1 caffeine drink daily. Has not done pelvic PT.  She does get adequate calcium and Vitamin D in her diet.  Labs with PCP.    Past Medical History:  Diagnosis Date  . Graves disease   . Hypothyroidism     Past Surgical History:  Procedure Laterality Date  . CESAREAN SECTION      Family History  Problem Relation Age of Onset  . Atrial fibrillation Mother   . Heart disease Father    . Parkinson's disease Father   . Dementia Father   . Anemia Sister   . Hypertension Sister   . Breast cancer Sister 43  . Hypertension Brother   . Breast cancer Maternal Grandmother 26  . Heart attack Maternal Grandfather   . Pancreatic cancer Paternal Grandmother   . Heart attack Paternal Grandfather   . Breast cancer Paternal Aunt 53  . Breast cancer Cousin 40  . Hypertension Other     Social History   Socioeconomic History  . Marital status: Married    Spouse name: Not on file  . Number of children: Not on file  . Years of education: Not on file  . Highest education level: Not on file  Occupational History  . Not on file  Tobacco Use  . Smoking status: Never Smoker  . Smokeless tobacco: Never Used  Vaping Use  . Vaping Use: Never used  Substance and Sexual Activity  . Alcohol use: Yes    Comment: occ  . Drug use: No  . Sexual activity: Yes    Birth control/protection: Post-menopausal  Other Topics Concern  . Not on file  Social History Narrative  . Not on file   Social Determinants of Health   Financial Resource Strain: Not on file  Food Insecurity: Not on file  Transportation Needs: Not on file  Physical Activity: Not on file  Stress: Not on  file  Social Connections: Not on file  Intimate Partner Violence: Not on file    Current Meds  Medication Sig  . CALCIUM PO Take 600 mg by mouth 2 (two) times daily.  . chlorthalidone (HYGROTON) 25 MG tablet Take 1 tablet (25 mg total) by mouth daily.  . clotrimazole-betamethasone (LOTRISONE) cream Apply externally BID for 2 wks  . Cranberry Fruit 475 MG CAPS Take by mouth.  . levothyroxine (EUTHYROX) 88 MCG tablet Take 1 tablet (88 mcg total) by mouth daily before breakfast.  . metoprolol succinate (TOPROL-XL) 25 MG 24 hr tablet TAKE 1 TABLET BY MOUTH ONCE DAILY (TAKE  WITH  OR  IMMEDIATELY  FOLLOWING  A  MEAL)  . Probiotic Product (PROBIOTIC ADVANCED PO) Take by mouth.      ROS:  Review of Systems   Constitutional: Negative for fatigue, fever and unexpected weight change.  Respiratory: Negative for cough, shortness of breath and wheezing.   Cardiovascular: Negative for chest pain, palpitations and leg swelling.  Gastrointestinal: Negative for blood in stool, constipation, diarrhea, nausea and vomiting.  Endocrine: Negative for cold intolerance, heat intolerance and polyuria.  Genitourinary: Negative for dyspareunia, dysuria, flank pain, frequency, genital sores, hematuria, menstrual problem, pelvic pain, urgency, vaginal bleeding, vaginal discharge and vaginal pain.  Musculoskeletal: Negative for back pain, joint swelling and myalgias.  Skin: Positive for rash.  Neurological: Negative for dizziness, syncope, light-headedness, numbness and headaches.  Hematological: Negative for adenopathy.  Psychiatric/Behavioral: Negative for agitation, confusion, sleep disturbance and suicidal ideas. The patient is not nervous/anxious.      Objective: BP 130/80   Ht 5\' 1"  (1.549 m)   Wt 133 lb (60.3 kg)   BMI 25.13 kg/m    Physical Exam Constitutional:      Appearance: She is well-developed.  Genitourinary:     Vulva normal.     Genitourinary Comments: BILAT LABIA MINORA WITH PALE SKIN, BRUISING FROM TRAUMA NEAR CLITORIS; NO EVID OF LICHEN SCLEROSUS; POS VAGINAL ATROPHY     Right Labia: No rash, tenderness or lesions.    Left Labia: rash.     Left Labia: No tenderness or lesions.       No vaginal discharge, erythema or tenderness.      Right Adnexa: not tender and no mass present.    Left Adnexa: not tender and no mass present.    No cervical motion tenderness, friability or polyp.     Uterus is not enlarged or tender.  Breasts:     Right: No mass, nipple discharge, skin change or tenderness.     Left: No mass, nipple discharge, skin change or tenderness.    Neck:     Thyroid: No thyromegaly.  Cardiovascular:     Rate and Rhythm: Normal rate and regular rhythm.     Heart  sounds: Normal heart sounds. No murmur heard.   Pulmonary:     Effort: Pulmonary effort is normal.     Breath sounds: Normal breath sounds.  Abdominal:     Palpations: Abdomen is soft.     Tenderness: There is no abdominal tenderness. There is no guarding or rebound.  Musculoskeletal:        General: Normal range of motion.     Cervical back: Normal range of motion.  Lymphadenopathy:     Cervical: No cervical adenopathy.  Neurological:     General: No focal deficit present.     Mental Status: She is alert and oriented to person, place, and time.  Cranial Nerves: No cranial nerve deficit.  Skin:    General: Skin is warm and dry.  Psychiatric:        Mood and Affect: Mood normal.        Behavior: Behavior normal.        Thought Content: Thought content normal.        Judgment: Judgment normal.  Vitals reviewed.     Assessment/Plan:  Encounter for annual routine gynecological examination  Cervical cancer screening - Plan: Cytology - PAP  Screening for HPV (human papillomavirus) - Plan: Cytology - PAP  Encounter for screening mammogram for malignant neoplasm of breast; pt current on mammo.  Family history of breast cancer--MyRisk testing discussed and declined today. F/u prn.  Chronic vaginitis - Plan: clotrimazole-betamethasone (LOTRISONE) cream; perianal area. Question fungal vs LS. Rx lotrisone crm BID for 2 wks. RTO for recheck in 2 wks. If sx persist, will do bx for LS.   SUI--cont kegels, d/c caffeine before exercise. Can refer to pelvic PT but pt wants to hold off for now.   Meds ordered this encounter  Medications  . clotrimazole-betamethasone (LOTRISONE) cream    Sig: Apply externally BID for 2 wks    Dispense:  15 g    Refill:  0    Order Specific Question:   Supervising Provider    Answer:   Nadara Mustard [[id number]]           GYN counsel mammography screening, menopause, adequate intake of calcium and vitamin D, diet and exercise    F/U  Return in  about 2 weeks (around 10/26/2020) for vaginitis f/u.  Nykayla Marcelli B. Melvena Vink, PA-C 10/12/2020 2:59 PM

## 2020-10-12 ENCOUNTER — Other Ambulatory Visit (HOSPITAL_COMMUNITY)
Admission: RE | Admit: 2020-10-12 | Discharge: 2020-10-12 | Disposition: A | Discharge status: Home / Self Care | Payer: BC Managed Care – PPO | Source: Ambulatory Visit | Attending: Obstetrics and Gynecology | Admitting: Obstetrics and Gynecology

## 2020-10-12 ENCOUNTER — Ambulatory Visit (INDEPENDENT_AMBULATORY_CARE_PROVIDER_SITE_OTHER): Payer: BC Managed Care – PPO | Admitting: Obstetrics and Gynecology

## 2020-10-12 ENCOUNTER — Other Ambulatory Visit: Payer: Self-pay

## 2020-10-12 ENCOUNTER — Encounter: Payer: Self-pay | Admitting: Obstetrics and Gynecology

## 2020-10-12 VITALS — BP 130/80 | Ht 61.0 in | Wt 133.0 lb

## 2020-10-12 DIAGNOSIS — Z803 Family history of malignant neoplasm of breast: Secondary | ICD-10-CM

## 2020-10-12 DIAGNOSIS — Z1151 Encounter for screening for human papillomavirus (HPV): Secondary | ICD-10-CM | POA: Insufficient documentation

## 2020-10-12 DIAGNOSIS — N761 Subacute and chronic vaginitis: Secondary | ICD-10-CM

## 2020-10-12 DIAGNOSIS — Z1231 Encounter for screening mammogram for malignant neoplasm of breast: Secondary | ICD-10-CM | POA: Diagnosis not present

## 2020-10-12 DIAGNOSIS — Z124 Encounter for screening for malignant neoplasm of cervix: Secondary | ICD-10-CM | POA: Diagnosis present

## 2020-10-12 DIAGNOSIS — Z01419 Encounter for gynecological examination (general) (routine) without abnormal findings: Secondary | ICD-10-CM

## 2020-10-12 DIAGNOSIS — N393 Stress incontinence (female) (male): Secondary | ICD-10-CM | POA: Insufficient documentation

## 2020-10-12 MED ORDER — CLOTRIMAZOLE-BETAMETHASONE 1-0.05 % EX CREA: TOPICAL_CREAM | CUTANEOUS | 0 refills | Status: DC

## 2020-10-12 NOTE — Patient Instructions (Signed)
I value your feedback and you entrusting us with your care. If you get a [facility] patient survey, I would appreciate you taking the time to let us know about your experience today. Thank you!  [facility]: [phone]  [facility]: [phone]    

## 2020-10-14 LAB — CYTOLOGY - PAP
Adequacy: ABSENT
Comment: NEGATIVE
Diagnosis: NEGATIVE
High risk HPV: NEGATIVE

## 2020-10-15 LAB — LIPID PANEL
Chol/HDL Ratio: 5 ratio — ABNORMAL HIGH (ref 0.0–4.4)
Cholesterol, Total: 184 mg/dL (ref 100–199)
HDL: 37 mg/dL — ABNORMAL LOW (ref >39)
LDL Chol Calc (NIH): 117 mg/dL — ABNORMAL HIGH (ref 0–99)
Triglycerides: 169 mg/dL — ABNORMAL HIGH (ref 0–149)
VLDL Cholesterol Cal: 30 mg/dL (ref 5–40)

## 2020-10-15 LAB — TSH: TSH: 2.55 u[IU]/mL (ref 0.450–4.500)

## 2020-10-25 NOTE — Progress Notes (Deleted)
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   HPI:      [name] is a 65 y.o. [id number] whose LMP was No LMP recorded. Patient is postmenopausal., presents today for vaginitis f/u from 10/12/20  Hx of episodic ext vaginal itching, without increased d/c, odor. Sx intermittent, sometimes worse after exercise. Has damp underwear due to urine leakage/SUI. Chronic vaginitis - Plan: clotrimazole-betamethasone (LOTRISONE) cream; perianal area. Question fungal vs LS. Rx lotrisone crm BID for 2 wks. RTO for recheck in 2 wks. If sx persist, will do bx for LS.   Past Medical History:  Diagnosis Date  . Graves disease   . Hypothyroidism     Past Surgical History:  Procedure Laterality Date  . CESAREAN SECTION      Family History  Problem Relation Age of Onset  . Atrial fibrillation Mother   . Heart disease Father   . Parkinson's disease Father   . Dementia Father   . Anemia Sister   . Hypertension Sister   . Breast cancer Sister 51  . Hypertension Brother   . Breast cancer Maternal Grandmother 75  . Heart attack Maternal Grandfather   . Pancreatic cancer Paternal Grandmother   . Heart attack Paternal Grandfather   . Breast cancer Paternal Aunt 35  . Breast cancer Cousin 40  . Hypertension Other     Social History   Socioeconomic History  . Marital status: Married    Spouse name: Not on file  . Number of children: Not on file  . Years of education: Not on file  . Highest education level: Not on file  Occupational History  . Not on file  Tobacco Use  . Smoking status: Never Smoker  . Smokeless tobacco: Never Used  Vaping Use  . Vaping Use: Never used  Substance and Sexual Activity  . Alcohol use: Yes    Comment: occ  . Drug use: No  . Sexual activity: Yes    Birth control/protection: Post-menopausal  Other Topics Concern  . Not on file  Social History Narrative  . Not on file   Social Determinants of Health   Financial Resource  Strain: Not on file  Food Insecurity: Not on file  Transportation Needs: Not on file  Physical Activity: Not on file  Stress: Not on file  Social Connections: Not on file  Intimate Partner Violence: Not on file    Outpatient Medications Prior to Visit  Medication Sig Dispense Refill  . CALCIUM PO Take 600 mg by mouth 2 (two) times daily.    . chlorthalidone (HYGROTON) 25 MG tablet Take 1 tablet (25 mg total) by mouth daily. 90 tablet 3  . clotrimazole-betamethasone (LOTRISONE) cream Apply externally BID for 2 wks 15 g 0  . Cranberry Fruit 475 MG CAPS Take by mouth.    . levothyroxine (EUTHYROX) 88 MCG tablet Take 1 tablet (88 mcg total) by mouth daily before breakfast. 90 tablet 1  . metoprolol succinate (TOPROL-XL) 25 MG 24 hr tablet TAKE 1 TABLET BY MOUTH ONCE DAILY (TAKE  WITH  OR  IMMEDIATELY  FOLLOWING  A  MEAL) 90 tablet 0  . Probiotic Product (PROBIOTIC ADVANCED PO) Take by mouth.     No facility-administered medications prior to visit.      ROS:  Review of Systems BREAST: No symptoms   OBJECTIVE:   Vitals:  There were no vitals taken for this visit.  Physical Exam  Results: No results found  for this or any previous visit (from the past 24 hour(s)).   Assessment/Plan: No diagnosis found.    No orders of the defined types were placed in this encounter.     No follow-ups on file.  Joslin Doell B. Lexie Morini, PA-C 10/25/2020 2:52 PM

## 2020-10-26 ENCOUNTER — Telehealth: Payer: BC Managed Care – PPO | Admitting: Emergency Medicine

## 2020-10-26 ENCOUNTER — Ambulatory Visit: Payer: BC Managed Care – PPO | Admitting: Obstetrics and Gynecology

## 2020-10-26 ENCOUNTER — Encounter: Payer: Self-pay | Admitting: Obstetrics and Gynecology

## 2020-10-26 ENCOUNTER — Encounter: Payer: Self-pay | Admitting: Emergency Medicine

## 2020-10-26 ENCOUNTER — Telehealth: Payer: Self-pay | Admitting: Family Medicine

## 2020-10-26 DIAGNOSIS — R059 Cough, unspecified: Secondary | ICD-10-CM | POA: Diagnosis not present

## 2020-10-26 MED ORDER — BENZONATATE 100 MG PO CAPS: 100.0000 mg | ORAL_CAPSULE | Freq: Two times a day (BID) | ORAL | 0 refills | Status: DC | PRN

## 2020-10-26 MED ORDER — AZITHROMYCIN 250 MG PO TABS: ORAL_TABLET | ORAL | 0 refills | Status: DC

## 2020-10-26 NOTE — Telephone Encounter (Signed)
Sorry, recommend urgent care or telemedicine visit through [facility].

## 2020-10-26 NOTE — Progress Notes (Signed)
Ms. Tina, Marshall are scheduled for a virtual visit with your provider today.    Just as we do with appointments in the office, we must obtain your consent to participate.  Your consent will be active for this visit and any virtual visit you may have with one of our providers in the next 365 days.    If you have a MyChart account, I can also send a copy of this consent to you electronically.  All virtual visits are billed to your insurance company just like a traditional visit in the office.  As this is a virtual visit, video technology does not allow for your provider to perform a traditional examination.  This may limit your provider's ability to fully assess your condition.  If your provider identifies any concerns that need to be evaluated in person or the need to arrange testing such as labs, EKG, etc, we will make arrangements to do so.    Although advances in technology are sophisticated, we cannot ensure that it will always work on either your end or our end.  If the connection with a video visit is poor, we may have to switch to a telephone visit.  With either a video or telephone visit, we are not always able to ensure that we have a secure connection.   I need to obtain your verbal consent now.   Are you willing to proceed with your visit today?   Tina Marshall has provided verbal consent on 10/26/2020 for a virtual visit (video or telephone).   Roxy Horseman, PA-C 10/26/2020  12:24 PM     Virtual Visit via Video   I connected with patient on 10/26/20 at 12:30 PM EDT by a video enabled telemedicine application and verified that I am speaking with the correct person using two identifiers.  Location patient: Home Location provider: [facility] Persons participating in the virtual visit: Patient, Provider  I discussed the limitations of evaluation and management by telemedicine and the availability of in person appointments. The patient expressed understanding and  agreed to proceed.  Subjective:   HPI:   Patient presents via [facility] today with chief complaint of cough and sore throat.  Symptoms started last Tuesday (7 days ago).  Has had 3 negative covid tests.  Febrile to 101 at night.  Feels better in the day.  Has tried taking Mucinex.  States that her cough is rattly.   ROS:   See pertinent positives and negatives per HPI.  Patient Active Problem List   Diagnosis Date Noted  . SUI (stress urinary incontinence, female) 10/12/2020  . Major depressive disorder with single episode, in full remission (HCC) 03/17/2020  . Essential hypertension 09/19/2017  . Anxiety 09/19/2017  . Family history of breast cancer 02/07/2017  . 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 Affective disorder, major 11/08/2014    Social History   Tobacco Use  . Smoking status: Never Smoker  . Smokeless tobacco: Never Used  Substance Use Topics  . Alcohol use: Yes    Comment: occ    Current Outpatient Medications:  .  CALCIUM PO, Take 600 mg by mouth 2 (two) times daily., Disp: , Rfl:  .  chlorthalidone (HYGROTON) 25 MG tablet, Take 1 tablet (25 mg total) by mouth daily., Disp: 90 tablet, Rfl: 3 .  clotrimazole-betamethasone (LOTRISONE) cream, Apply externally BID for 2 wks, Disp: 15 g, Rfl: 0 .  Cranberry Fruit 475 MG CAPS, Take by mouth., Disp: ,  Rfl:  .  levothyroxine (EUTHYROX) 88 MCG tablet, Take 1 tablet (88 mcg total) by mouth daily before breakfast., Disp: 90 tablet, Rfl: 1 .  metoprolol succinate (TOPROL-XL) 25 MG 24 hr tablet, TAKE 1 TABLET BY MOUTH ONCE DAILY (TAKE  WITH  OR  IMMEDIATELY  FOLLOWING  A  MEAL), Disp: 90 tablet, Rfl: 0 .  Probiotic Product (PROBIOTIC ADVANCED PO), Take by mouth., Disp: , Rfl:   No Known Allergies  Objective:   There were no vitals taken for this visit.  Patient is well-developed, well-nourished in no acute distress.  Resting comfortably  at home.  Head is normocephalic, atraumatic.  No  labored breathing.  Speech is clear and coherent with logical content.  Patient is alert and oriented at baseline.  Coarse coughing during exam  Assessment and Plan:   1. Cough  - Trial Z-pak and Tessalon  - F/u if not improving   Roxy Horseman, PA-C 10/26/2020

## 2020-10-26 NOTE — Telephone Encounter (Signed)
Please advise 

## 2020-10-26 NOTE — Telephone Encounter (Signed)
Replied to patient through e-mail and advised below.

## 2020-10-26 NOTE — Telephone Encounter (Signed)
Patient called to see if she could get an appt. Today virtual or phone.  There was nothing available until June.  Patient asked if the doctor could send a script in for her cough.  Please advise and call patient to discuss at [phone]

## 2020-10-26 NOTE — Patient Instructions (Signed)
We are sorry that you are not feeling well.  Here is how we plan to help!  Based on your presentation I believe you most likely have A cough due to bacteria.  When patients have a fever and a productive cough with a change in color or increased sputum production, we are concerned about bacterial bronchitis.  If left untreated it can progress to pneumonia.  If your symptoms do not improve with your treatment plan it is important that you contact your provider.   I have prescribed Azithromyin 250 mg: two tablets now and then one tablet daily for 4 additonal days    In addition you may use A prescription cough medication called Tessalon Perles 100mg . You may take 1-2 capsules every 8 hours as needed for your cough.  From your responses in the eVisit questionnaire you describe inflammation in the upper respiratory tract which is causing a significant cough.  This is commonly called Bronchitis and has four common causes:    Allergies  Viral Infections  Acid Reflux  Bacterial Infection Allergies, viruses and acid reflux are treated by controlling symptoms or eliminating the cause. An example might be a cough caused by taking certain blood pressure medications. You stop the cough by changing the medication. Another example might be a cough caused by acid reflux. Controlling the reflux helps control the cough.     HOME CARE . Only take medications as instructed by your medical team. . Complete the entire course of an antibiotic. . Drink plenty of fluids and get plenty of rest. . Avoid close contacts especially the very young and the elderly . Cover your mouth if you cough or cough into your sleeve. . Always remember to wash your hands . A steam or ultrasonic humidifier can help congestion.   GET HELP RIGHT AWAY IF: . You develop worsening fever. . You become short of breath . You cough up blood. . Your symptoms persist after you have completed your treatment plan MAKE SURE YOU   Understand  these instructions.  Will watch your condition.  Will get help right away if you are not doing well or get worse.

## 2020-10-27 NOTE — Telephone Encounter (Signed)
Patient is scheduled for 11/09/20

## 2020-10-31 ENCOUNTER — Other Ambulatory Visit: Payer: Self-pay | Admitting: Family Medicine

## 2020-10-31 DIAGNOSIS — I10 Essential (primary) hypertension: Secondary | ICD-10-CM

## 2020-10-31 NOTE — Telephone Encounter (Signed)
Requested Prescriptions  Pending Prescriptions Disp Refills  . metoprolol succinate (TOPROL-XL) 25 MG 24 hr tablet [Pharmacy Med Name: Metoprolol Succinate ER 25 MG Oral Tablet Extended Release 24 Hour] 90 tablet 1    Sig: TAKE 1 TABLET BY MOUTH ONCE DAILY (TAKE  WITH  OR  IMMEDIATELY  FOLLOWING  A  MEAL)     Cardiovascular:  Beta Blockers Passed - 10/31/2020 10:49 AM      Passed - Last BP in normal range    BP Readings from Last 1 Encounters:  10/12/20 130/80         Passed - Last Heart Rate in normal range    Pulse Readings from Last 1 Encounters:  09/07/20 63         Passed - Valid encounter within last 6 months    Recent Outpatient Visits          1 month ago Annual physical exam   [facility] Maple Hudson., MD   6 months ago Essential hypertension   [facility] Maple Hudson., MD   7 months ago Essential hypertension   [facility] Maple Hudson., MD   1 year ago Annual physical exam   [facility] Maple Hudson., MD   1 year ago Adult hypothyroidism   [facility] Maple Hudson., MD      Future Appointments            In 10 months Maple Hudson., MD [facility]

## 2020-11-08 NOTE — Progress Notes (Signed)
Maple Hudson., MD   Chief Complaint  Patient presents with  . Follow-up    HPI:      [name] is a 65 y.o. [id number] whose LMP was No LMP recorded. Patient is postmenopausal., presents today for chronic vaginitis f/u from 10/12/20. Treated wih lotrisone crm for 2 wks for possible fungal derm. Sx improved but here for check up due to hypertrophy of perineal/perianal skin with fissures at 10/12/20 appt. Hx of episodic ext vaginal itching, without increased d/c, odor. Sx intermittent, sometimes worse after exercise. Has damp underwear due to urine leakage/SUI. Has seen derm for a eczematous type rash on the palmar surface of bilat hands.  Past Medical History:  Diagnosis Date  . Graves disease   . Hypothyroidism     Past Surgical History:  Procedure Laterality Date  . CESAREAN SECTION      Family History  Problem Relation Age of Onset  . Atrial fibrillation Mother   . Heart disease Father   . Parkinson's disease Father   . Dementia Father   . Anemia Sister   . Hypertension Sister   . Breast cancer Sister 49  . Hypertension Brother   . Breast cancer Maternal Grandmother 59  . Heart attack Maternal Grandfather   . Pancreatic cancer Paternal Grandmother   . Heart attack Paternal Grandfather   . Breast cancer Paternal Aunt 81  . Breast cancer Cousin 40  . Hypertension Other     Social History   Socioeconomic History  . Marital status: Married    Spouse name: Not on file  . Number of children: Not on file  . Years of education: Not on file  . Highest education level: Not on file  Occupational History  . Not on file  Tobacco Use  . Smoking status: Never Smoker  . Smokeless tobacco: Never Used  Vaping Use  . Vaping Use: Never used  Substance and Sexual Activity  . Alcohol use: Yes    Comment: occ  . Drug use: No  . Sexual activity: Yes    Birth control/protection: Post-menopausal  Other Topics Concern  . Not on file  Social History Narrative   . Not on file   Social Determinants of Health   Financial Resource Strain: Not on file  Food Insecurity: Not on file  Transportation Needs: Not on file  Physical Activity: Not on file  Stress: Not on file  Social Connections: Not on file  Intimate Partner Violence: Not on file    Outpatient Medications Prior to Visit  Medication Sig Dispense Refill  . CALCIUM PO Take 600 mg by mouth 2 (two) times daily.    . chlorthalidone (HYGROTON) 25 MG tablet Take 1 tablet (25 mg total) by mouth daily. 90 tablet 3  . clotrimazole-betamethasone (LOTRISONE) cream Apply externally BID for 2 wks 15 g 0  . Cranberry Fruit 475 MG CAPS Take by mouth.    . levothyroxine (EUTHYROX) 88 MCG tablet Take 1 tablet (88 mcg total) by mouth daily before breakfast. 90 tablet 1  . metoprolol succinate (TOPROL-XL) 25 MG 24 hr tablet TAKE 1 TABLET BY MOUTH ONCE DAILY (TAKE  WITH  OR  IMMEDIATELY  FOLLOWING  A  MEAL) 90 tablet 1  . Probiotic Product (PROBIOTIC ADVANCED PO) Take by mouth.    Marland Kitchen azithromycin (ZITHROMAX) 250 MG tablet Take 2 tabs today, then take 1 tab daily until gone. 6 tablet 0  . benzonatate (TESSALON) 100 MG capsule Take 1  capsule (100 mg total) by mouth 2 (two) times daily as needed for cough. 20 capsule 0   No facility-administered medications prior to visit.      ROS:  Review of Systems  Constitutional: Negative for fever.  Gastrointestinal: Negative for blood in stool, constipation, diarrhea, nausea and vomiting.  Genitourinary: Negative for dyspareunia, dysuria, flank pain, frequency, hematuria, urgency, vaginal bleeding, vaginal discharge and vaginal pain.  Musculoskeletal: Negative for back pain.  Skin: Negative for rash.    OBJECTIVE:   Vitals:  BP 130/80   Ht 5\' 1"  (1.549 m)   Wt 132 lb (59.9 kg)   BMI 24.94 kg/m   Physical Exam Constitutional:      Appearance: Normal appearance.  Pulmonary:     Effort: Pulmonary effort is normal.  Genitourinary:    Labia:         Right: No tenderness or lesion.        Left: No tenderness or lesion.     Musculoskeletal:        General: Normal range of motion.  Neurological:     Mental Status: She is alert and oriented to person, place, and time.  Psychiatric:        Judgment: Judgment normal.   VULVAR BIOPSY NOTE The indications for vulvar biopsy (rule out neoplasia, establish lichen sclerosus diagnosis) were reviewed.   Risks of the biopsy including pain, bleeding, infection, inadequate specimen, scarring and need for additional procedures  were discussed. The patient stated understanding and agreed to undergo procedure today.   The patient's vulva was prepped with Betadine. 1% lidocaine was injected into area of concern. A 3 -mm punch biopsy was done, biopsy tissue was picked up with sterile forceps and sterile scissors were used to excise the lesion.  Small bleeding was noted and hemostasis was achieved using silver nitrate sticks.  The patient tolerated the procedure well. Post-procedure instructions  (pelvic rest for one week) were given to the patient. The patient is to call with heavy bleeding, fever greater than 100.4, foul smelling vaginal discharge or other concerns.    Assessment/Plan: Perianal itch--Plan: Surgical pathology; question LS. Bx today, wound care instructions. Will f/u with results. If LS, will treat with clobetasol Rx. Pt aware of need for yearly exam due to slightly increased risk of SCC.   Perianal dermatitis   Return if symptoms worsen or fail to improve.  [name] B. Aunisty Reali, PA-C 11/09/2020 9:58 AM

## 2020-11-09 ENCOUNTER — Ambulatory Visit (INDEPENDENT_AMBULATORY_CARE_PROVIDER_SITE_OTHER): Payer: BC Managed Care – PPO | Admitting: Obstetrics and Gynecology

## 2020-11-09 ENCOUNTER — Encounter: Payer: Self-pay | Admitting: Obstetrics and Gynecology

## 2020-11-09 ENCOUNTER — Other Ambulatory Visit (HOSPITAL_COMMUNITY)
Admission: RE | Admit: 2020-11-09 | Discharge: 2020-11-09 | Disposition: A | Discharge status: Home / Self Care | Payer: BC Managed Care – PPO | Source: Ambulatory Visit | Attending: Obstetrics and Gynecology | Admitting: Obstetrics and Gynecology

## 2020-11-09 ENCOUNTER — Other Ambulatory Visit: Payer: Self-pay

## 2020-11-09 VITALS — BP 130/80 | Ht 61.0 in | Wt 132.0 lb

## 2020-11-09 DIAGNOSIS — L309 Dermatitis, unspecified: Secondary | ICD-10-CM | POA: Diagnosis not present

## 2020-11-09 DIAGNOSIS — L290 Pruritus ani: Secondary | ICD-10-CM | POA: Insufficient documentation

## 2020-11-09 NOTE — Patient Instructions (Signed)
I value your feedback and you entrusting us with your care. If you get a [facility] patient survey, I would appreciate you taking the time to let us know about your experience today. Thank you! ? ? ?

## 2020-11-11 LAB — SURGICAL PATHOLOGY

## 2020-11-11 MED ORDER — CLOBETASOL PROPIONATE 0.05 % EX OINT: TOPICAL_OINTMENT | CUTANEOUS | 1 refills | Status: DC

## 2020-11-11 NOTE — Addendum Note (Signed)
Addended by: Althea Grimmer B on: 11/11/2020 02:16 PM   Modules accepted: Orders

## 2021-02-09 ENCOUNTER — Encounter: Payer: Self-pay | Admitting: Family Medicine

## 2021-02-09 ENCOUNTER — Telehealth (INDEPENDENT_AMBULATORY_CARE_PROVIDER_SITE_OTHER): Payer: BC Managed Care – PPO | Admitting: Family Medicine

## 2021-02-09 DIAGNOSIS — R059 Cough, unspecified: Secondary | ICD-10-CM

## 2021-02-09 DIAGNOSIS — J069 Acute upper respiratory infection, unspecified: Secondary | ICD-10-CM | POA: Diagnosis not present

## 2021-02-09 MED ORDER — ALBUTEROL SULFATE HFA 108 (90 BASE) MCG/ACT IN AERS: 2.0000 | INHALATION_SPRAY | Freq: Four times a day (QID) | RESPIRATORY_TRACT | 2 refills | Status: DC | PRN

## 2021-02-09 MED ORDER — PREDNISONE 20 MG PO TABS: 20.0000 mg | ORAL_TABLET | Freq: Every day | ORAL | 1 refills | Status: DC

## 2021-02-09 NOTE — Progress Notes (Signed)
MyChart Video Visit    Virtual Visit via Video Note   This visit type was conducted due to national recommendations for restrictions regarding the COVID-19 Pandemic (e.g. social distancing) in an effort to limit this patient's exposure and mitigate transmission in our community. This patient is at least at moderate risk for complications without adequate follow up. This format is felt to be most appropriate for this patient at this time. Physical exam was limited by quality of the video and audio technology used for the visit.   Patient location: Home Provider location: Office  I discussed the limitations of evaluation and management by telemedicine and the availability of in person appointments. The patient expressed understanding and agreed to proceed.  Patient: Tina Marshall   DOB: 1955/10/10   65 y.o. Female  MRN: [id number] Visit Date: 02/09/2021  Today's healthcare provider: Megan Mans, MD   No chief complaint on file.  Subjective    HPI  Delightful 65 year old lady who is vaccinated for COVID comes in today with onset a week ago of sore throat followed 2 days later by cough fever myalgias.  She has cough to the point where she gets gagged and vomits.  However, even during coughing fits her oxygen saturation does not drop below 95%.  She did do a home COVID test  2 days ago which was negative.   Medications: Outpatient Medications Prior to Visit  Medication Sig   CALCIUM PO Take 600 mg by mouth 2 (two) times daily.   chlorthalidone (HYGROTON) 25 MG tablet Take 1 tablet (25 mg total) by mouth daily.   clobetasol ointment (TEMOVATE) 0.05 % Apply pea-sized amount to affected area every night for 4 weeks, then every other night for 4 weeks and then twice a week as maintenance   clotrimazole-betamethasone (LOTRISONE) cream Apply externally BID for 2 wks   Cranberry Fruit 475 MG CAPS Take by mouth.   levothyroxine (EUTHYROX) 88 MCG tablet Take 1 tablet (88 mcg total)  by mouth daily before breakfast.   metoprolol succinate (TOPROL-XL) 25 MG 24 hr tablet TAKE 1 TABLET BY MOUTH ONCE DAILY (TAKE  WITH  OR  IMMEDIATELY  FOLLOWING  A  MEAL)   Probiotic Product (PROBIOTIC ADVANCED PO) Take by mouth.   No facility-administered medications prior to visit.    Review of Systems  Constitutional:  Positive for fever. Negative for appetite change, chills and fatigue.  HENT:  Positive for sore throat.   Respiratory:  Positive for cough. Negative for chest tightness and shortness of breath.   Cardiovascular:  Negative for chest pain and palpitations.  Gastrointestinal:  Negative for abdominal pain, nausea and vomiting.  Neurological:  Negative for dizziness and weakness.      Objective    There were no vitals taken for this visit.    Physical Exam     Assessment & Plan     1. Viral upper respiratory tract infection Discussed with patient that antibiotics are not indicated at this time.  She is certainly too far along of his COVID to treat and I think at this point in time it would not matter unless she worsens whether she gets tested or not.  She is 7 days into symptoms.  2. Cough For her cough will use Mucinex twice a day and have sent in an albuterol MDI and prednisone 20 mg daily for 5 days.  If she has a second worsening or the develops dyspnea she will let us know.  No follow-ups on file.     I discussed the assessment and treatment plan with the patient. The patient was provided an opportunity to ask questions and all were answered. The patient agreed with the plan and demonstrated an understanding of the instructions.   The patient was advised to call back or seek an in-person evaluation if the symptoms worsen or if the condition fails to improve as anticipated.  I provided 11 minutes of non-face-to-face time during this encounter.  I, Megan Mans, MD, have reviewed all documentation for this visit. The documentation on 02/09/21 for the  exam, diagnosis, procedures, and orders are all accurate and complete.   Jamariya Davidoff Wendelyn Breslow, MD [facility] [phone] (phone) [phone] (fax)  [facility]

## 2021-02-12 ENCOUNTER — Other Ambulatory Visit: Payer: Self-pay | Admitting: Family Medicine

## 2021-02-12 NOTE — Telephone Encounter (Signed)
Requested Prescriptions  Pending Prescriptions Disp Refills  . EUTHYROX 88 MCG tablet [Pharmacy Med Name: Euthyrox 88 MCG Oral Tablet] 90 tablet 2    Sig: TAKE 1 TABLET BY MOUTH BY MOUTH BEFORE BREAKFAST     Endocrinology:  Hypothyroid Agents Failed - 02/12/2021 11:06 AM      Failed - TSH needs to be rechecked within 3 months after an abnormal result. Refill until TSH is due.      Passed - TSH in normal range and within 360 days    TSH  Date Value Ref Range Status  10/14/2020 2.550 0.450 - 4.500 uIU/mL Final         Passed - Valid encounter within last 12 months    Recent Outpatient Visits          3 days ago Viral upper respiratory tract infection   [facility] Maple Hudson., MD   5 months ago Annual physical exam   [facility] Maple Hudson., MD   9 months ago Essential hypertension   [facility] Maple Hudson., MD   11 months ago Essential hypertension   [facility] Maple Hudson., MD   1 year ago Annual physical exam   [facility] Maple Hudson., MD      Future Appointments            In 6 months Maple Hudson., MD [facility]

## 2021-04-12 ENCOUNTER — Other Ambulatory Visit: Payer: Self-pay | Admitting: Obstetrics and Gynecology

## 2021-04-12 MED ORDER — CLOBETASOL PROPIONATE 0.05 % EX OINT: TOPICAL_OINTMENT | CUTANEOUS | 1 refills | Status: DC

## 2021-04-12 NOTE — Progress Notes (Signed)
Rx RF clobetasol for LS

## 2021-04-14 ENCOUNTER — Other Ambulatory Visit: Payer: Self-pay | Admitting: Family Medicine

## 2021-04-30 ENCOUNTER — Other Ambulatory Visit: Payer: Self-pay | Admitting: Family Medicine

## 2021-04-30 DIAGNOSIS — I10 Essential (primary) hypertension: Secondary | ICD-10-CM

## 2021-04-30 NOTE — Telephone Encounter (Signed)
Requested Prescriptions  Pending Prescriptions Disp Refills  . metoprolol succinate (TOPROL-XL) 25 MG 24 hr tablet [Pharmacy Med Name: Metoprolol Succinate ER 25 MG Oral Tablet Extended Release 24 Hour] 90 tablet 0    Sig: TAKE 1 TABLET BY MOUTH ONCE DAILY (TAKE  WITH  OR  IMMEDIATELY  FOLLOWING  A  MEAL)     Cardiovascular:  Beta Blockers Passed - 04/30/2021  7:51 AM      Passed - Last BP in normal range    BP Readings from Last 1 Encounters:  11/09/20 130/80         Passed - Last Heart Rate in normal range    Pulse Readings from Last 1 Encounters:  09/07/20 63         Passed - Valid encounter within last 6 months    Recent Outpatient Visits          2 months ago Viral upper respiratory tract infection   [facility] Maple Hudson., MD   7 months ago Annual physical exam   [facility] Maple Hudson., MD   1 year ago Essential hypertension   [facility] Maple Hudson., MD   1 year ago Essential hypertension   [facility] Maple Hudson., MD   1 year ago Annual physical exam   [facility] Maple Hudson., MD      Future Appointments            In 4 months Maple Hudson., MD [facility]

## 2021-05-09 ENCOUNTER — Telehealth: Payer: Self-pay | Admitting: Family Medicine

## 2021-05-09 DIAGNOSIS — E039 Hypothyroidism, unspecified: Secondary | ICD-10-CM

## 2021-05-09 MED ORDER — LEVOTHYROXINE SODIUM 88 MCG PO TABS: ORAL_TABLET | ORAL | 1 refills | Status: DC

## 2021-05-09 NOTE — Telephone Encounter (Signed)
Prescription sent to the pharmacy electronically.  °

## 2021-05-09 NOTE — Telephone Encounter (Signed)
[facility] faxed refill request for the following medications:   EUTHYROX 88 MCG tablet   Please advise.  

## 2021-07-19 ENCOUNTER — Other Ambulatory Visit: Payer: Self-pay | Admitting: Family Medicine

## 2021-07-19 DIAGNOSIS — Z1231 Encounter for screening mammogram for malignant neoplasm of breast: Secondary | ICD-10-CM

## 2021-08-01 ENCOUNTER — Encounter: Payer: Self-pay | Admitting: Obstetrics and Gynecology

## 2021-08-02 ENCOUNTER — Other Ambulatory Visit: Payer: Self-pay | Admitting: Obstetrics and Gynecology

## 2021-08-02 MED ORDER — CLOBETASOL PROPIONATE 0.05 % EX OINT: TOPICAL_OINTMENT | CUTANEOUS | 0 refills | Status: DC

## 2021-08-02 NOTE — Progress Notes (Signed)
Rx RF clobetasol oint for LS, appt due 5/23

## 2021-08-03 ENCOUNTER — Other Ambulatory Visit: Payer: Self-pay | Admitting: Family Medicine

## 2021-08-03 DIAGNOSIS — I10 Essential (primary) hypertension: Secondary | ICD-10-CM

## 2021-08-23 ENCOUNTER — Other Ambulatory Visit: Payer: Self-pay

## 2021-08-23 ENCOUNTER — Ambulatory Visit
Admission: RE | Admit: 2021-08-23 | Discharge: 2021-08-23 | Disposition: A | Discharge status: Home / Self Care | Payer: Medicare PPO | Source: Ambulatory Visit | Attending: Family Medicine | Admitting: Family Medicine

## 2021-08-23 DIAGNOSIS — Z1231 Encounter for screening mammogram for malignant neoplasm of breast: Secondary | ICD-10-CM | POA: Insufficient documentation

## 2021-09-08 ENCOUNTER — Encounter: Payer: Self-pay | Admitting: Family Medicine

## 2021-09-08 ENCOUNTER — Ambulatory Visit (INDEPENDENT_AMBULATORY_CARE_PROVIDER_SITE_OTHER): Payer: Medicare PPO | Admitting: Family Medicine

## 2021-09-08 VITALS — BP 134/87 | HR 60 | Temp 98.4°F | Resp 16 | Ht 61.0 in | Wt 132.5 lb

## 2021-09-08 DIAGNOSIS — Z0000 Encounter for general adult medical examination without abnormal findings: Secondary | ICD-10-CM | POA: Diagnosis not present

## 2021-09-08 DIAGNOSIS — Z13820 Encounter for screening for osteoporosis: Secondary | ICD-10-CM | POA: Diagnosis not present

## 2021-09-08 NOTE — Progress Notes (Signed)
? ? ?Medicare Initial Preventative Physical Exam ? ? ?I,[name],acting as a scribe for Tina Durie, MD.,have documented all relevant documentation on the behalf of Tina Durie, MD,as directed by  Tina Durie, MD while in the presence of Tina Durie, MD. ? ? ?Patient: Tina Marshall, Female    DOB: [birth year], 66 y.o.   MRN: [id number] ?Visit Date: 09/08/2021 ? ?Today's Provider: [name], MD  ? ?Subjective:  ?  ?Chief Complaint  ?Patient presents with  ? Medicare Wellness  ? ? ?Medicare Initial Preventative Physical Exam ?LINDSY Marshall is a 65 y.o. female who presents today for her Initial Preventative Physical Exam.  ?She feels well.  She is enjoying her 3 grandchildren.  She is retired Radio producer. ?She does have some occasional abdominal bloating that might be associated with either dairy products or with gluten. ?HPI ? ?Social History  ? ?Socioeconomic History  ? Marital status: Married  ?  Spouse name: Not on file  ? Number of children: Not on file  ? Years of education: Not on file  ? Highest education level: Not on file  ?Occupational History  ? Not on file  ?Tobacco Use  ? Smoking status: Never  ? Smokeless tobacco: Never  ?Vaping Use  ? Vaping Use: Never used  ?Substance and Sexual Activity  ? Alcohol use: Yes  ?  Comment: occ  ? Drug use: No  ? Sexual activity: Yes  ?  Birth control/protection: Post-menopausal  ?Other Topics Concern  ? Not on file  ?Social History Narrative  ? Not on file  ? ?Social Determinants of Health  ? ?Financial Resource Strain: Not on file  ?Food Insecurity: Not on file  ?Transportation Needs: Not on file  ?Physical Activity: Not on file  ?Stress: Not on file  ?Social Connections: Not on file  ?Intimate Partner Violence: Not on file  ? ? ?Past Medical History:  ?Diagnosis Date  ? Graves disease   ? Hypothyroidism   ?  ? ?Patient Active Problem List  ? Diagnosis Date Noted  ? SUI (stress urinary incontinence, female) 10/12/2020  ? Major  depressive disorder with single episode, in full remission ([place]) 03/17/2020  ? Essential hypertension 09/19/2017  ? Anxiety 09/19/2017  ? Family history of breast cancer 02/07/2017  ? [place]ow disease 11/08/2014  ? Adult hypothyroidism 11/08/2014  ? Affective disorder, major 11/08/2014  ? ? ?Past Surgical History:  ?Procedure Laterality Date  ? CESAREAN SECTION    ? ? ?Her family history includes Anemia in her sister; Atrial fibrillation in her mother; Breast cancer (age of onset: 78) in her paternal aunt; Breast cancer (age of onset: 84) in her cousin; Breast cancer (age of onset: 2) in her sister; Breast cancer (age of onset: 46) in her maternal grandmother; Dementia in her father; Heart attack in her maternal grandfather and paternal grandfather; Heart disease in her father; Hypertension in her brother, sister, and another family member; Pancreatic cancer in her paternal grandmother; Parkinson's disease in her father. ? ? ?Current Outpatient Medications:  ?  CALCIUM PO, Take 600 mg by mouth 2 (two) times daily., Disp: , Rfl:  ?  chlorthalidone (HYGROTON) 25 MG tablet, Take 1 tablet by mouth once daily, Disp: 90 tablet, Rfl: 1 ?  clobetasol ointment (TEMOVATE) 0.05 %, Apply pea-sized amount to affected area twice a week as maintenance, Disp: 30 g, Rfl: 0 ?  clotrimazole-betamethasone (LOTRISONE) cream, Apply externally BID for 2 wks, Disp: 15 g, Rfl: 0 ?  Cranberry Fruit 475 MG CAPS, Take by mouth., Disp: , Rfl:  ?  levothyroxine (EUTHYROX) 88 MCG tablet, TAKE 1 TABLET BY MOUTH BY MOUTH BEFORE BREAKFAST, Disp: 90 tablet, Rfl: 1 ?  metoprolol succinate (TOPROL-XL) 25 MG 24 hr tablet, TAKE 1 TABLET BY MOUTH ONCE DAILY WITH OR IMMEDIATELY FOLLOWING A MEAL, Disp: 90 tablet, Rfl: 3 ?  Probiotic Product (PROBIOTIC ADVANCED PO), Take by mouth., Disp: , Rfl:   ? ?Patient Care Team: ?Jerrol Banana., MD as PCP - General (Family Medicine) ? ?Review of Systems  ?Gastrointestinal:  Positive for abdominal distention.   ?All other systems reviewed and are negative. ? ?Last metabolic panel ?Lab Results  ?Component Value Date  ? GLUCOSE 99 09/07/2020  ? NA 141 09/07/2020  ? K 3.9 09/07/2020  ? CL 100 09/07/2020  ? CO2 26 09/07/2020  ? BUN 13 09/07/2020  ? CREATININE 0.96 09/07/2020  ? EGFR 66 09/07/2020  ? CALCIUM 9.6 09/07/2020  ? PROT 7.2 09/07/2020  ? ALBUMIN 4.7 09/07/2020  ? LABGLOB 2.5 09/07/2020  ? AGRATIO 1.9 09/07/2020  ? BILITOT 0.5 09/07/2020  ? ALKPHOS 82 09/07/2020  ? AST 34 09/07/2020  ? ALT 32 09/07/2020  ? ?  ?  ?Objective:  ?  ?Vitals: BP 134/87 (BP Location: Left Arm, Patient Position: Sitting, Cuff Size: Normal)   Pulse 60   Temp 98.4 ?F (36.9 ?C) (Temporal)   Resp 16   Ht 5' 1"  (1.549 m)   Wt 132 lb 8 oz (60.1 kg)   SpO2 98%   BMI 25.04 kg/m?  ?Vision Screening  ? Right eye Left eye Both eyes  ?Without correction     ?With correction 20/20 20/30 20/20   ? ?Physical Exam ?Vitals reviewed.  ?Constitutional:   ?   Appearance: She is well-developed.  ?HENT:  ?   Head: Normocephalic and atraumatic.  ?   Right Ear: External ear normal.  ?   Left Ear: External ear normal.  ?   Nose: Nose normal.  ?Eyes:  ?   General: No scleral icterus. ?   Conjunctiva/sclera: Conjunctivae normal.  ?Neck:  ?   Thyroid: No thyromegaly.  ?Cardiovascular:  ?   Rate and Rhythm: Normal rate and regular rhythm.  ?   Heart sounds: Normal heart sounds.  ?Pulmonary:  ?   Effort: Pulmonary effort is normal.  ?   Breath sounds: Normal breath sounds.  ?Abdominal:  ?   Palpations: Abdomen is soft.  ?Musculoskeletal:  ?   Right lower leg: No edema.  ?   Left lower leg: No edema.  ?Skin: ?   General: Skin is warm and dry.  ?Neurological:  ?   General: No focal deficit present.  ?   Mental Status: She is alert and oriented to person, place, and time.  ?Psychiatric:     ?   Mood and Affect: Mood normal.     ?   Behavior: Behavior normal.     ?   Thought Content: Thought content normal.     ?   Judgment: Judgment normal.  ? ? ? ?Vision Screening  ?  Right eye Left eye Both eyes  ?Without correction     ?With correction 20/20 20/30 20/20   ?  ?Activities of Daily Living ? ?  09/08/2021  ?  9:48 AM  ?In your present state of health, do you have any difficulty performing the following activities:  ?Hearing? 0  ?Vision? 0  ?Difficulty concentrating or making decisions? 0  ?  Walking or climbing stairs? 0  ?Dressing or bathing? 0  ?Doing errands, shopping? 0  ? ? ?Fall Risk Assessment ? ?  09/08/2021  ?  9:47 AM 03/16/2020  ?  1:32 PM 03/07/2019  ?  9:44 AM 02/20/2018  ? 10:20 AM 01/30/2017  ? 10:06 AM  ?Fall Risk   ?Falls in the past year? 0 0 0 Yes No  ?Number falls in past yr: 0 0 0 1   ?Injury with Fall? 0 0 0 Yes   ?Risk for fall due to : No Fall Risks No Fall Risks  History of fall(s)   ?Follow up Falls evaluation completed Falls evaluation completed Falls evaluation completed Education provided   ? ?  ?Depression Screen ? ?  09/08/2021  ?  9:47 AM 09/07/2020  ?  9:43 AM 03/16/2020  ?  1:31 PM 09/08/2019  ?  8:18 AM  ?PHQ 2/9 Scores  ?PHQ - 2 Score 0 0 0 0  ?PHQ- 9 Score 0 0 0 0  ? ? ?   ? View : No data to display.  ?  ?  ?  ? ? ?  ?Assessment & Plan:  ?  ?Initial Preventative Physical Exam ? ?Reviewed patient's Family Medical History ?Reviewed and updated list of patient's medical providers ?Assessment of cognitive impairment was done ?Assessed patient's functional ability ?Established a written schedule for health screening services ?Health Risk Assessent Completed and Reviewed ? ?Exercise Activities and Dietary recommendations ? Goals   ?None ?  ? ? ?Immunization History  ?Administered Date(s) Administered  ? Influenza Inj Mdck Quad Pf 03/18/2017  ? Influenza,inj,Quad PF,6+ Mos 03/12/2015, 03/14/2021  ? Influenza,inj,quad, With Preservative 03/24/2016  ? Influenza-Unspecified 02/27/2018, 03/09/2020  ? PFIZER(Purple Top)SARS-COV-2 Vaccination 09/02/2019, 09/23/2019  ? Tdap 01/04/2012  ? Zoster Recombinat (Shingrix) 04/17/2018, 07/24/2018  ? Zoster, Live 12/07/2010   ? ? ?Health Maintenance  ?Topic Date Due  ? HIV Screening  Never done  ? Hepatitis C Screening  Never done  ? COVID-19 Vaccine (3 - Pfizer risk series) 10/21/2019  ? Pneumonia Vaccine 35+ Years old (1 - PCV) [place]

## 2021-09-08 NOTE — Patient Instructions (Signed)
TRY ELIMINATING DAIRY PRODUCTS FROM YOUR DIET. ?

## 2021-10-15 ENCOUNTER — Other Ambulatory Visit: Payer: Self-pay | Admitting: Family Medicine

## 2021-10-16 NOTE — Progress Notes (Deleted)
PCP: Maple HudsonGilbert, Richard L Jr., MD   No chief complaint on file.   HPI:      [name] is a 66 y.o. [phone] who LMP was No LMP recorded. Patient is postmenopausal., presents today for her annual examination.  Her menses are absent due to menopause. She does not have PMB. She does not have vasomotor sx.  Sex activity: single partner, contraception - post menopausal status. She does not have vaginal dryness. Hx of episodic ext vaginal itching, without increased d/c, odor. Sx intermittent, sometimes worse after exercise. Has damp underwear due to urine leakage/SUI.  Has seen derm for a eczematous type rash on the palmar surface of bilat hands.  Last Pap: 10/12/20  Results were: no abnormalities /neg HPV DNA.  Hx of STDs: none  Last mammogram: 08/23/21  Results were: normal--routine follow-up in 12 months There is a FH of breast cancer in her MGM, sister, mat cousin, and pat aunt and pancreatic cancer in her PGM, genetic testing hasn't been done by pt. Pt states affected sister did some type of testing and [facility] were [facility] ([facility] Pt declined MyRisk testing at last appt. There is no FH of ovarian cancer. The patient does do self-breast exams.  Colonoscopy: colonoscopy ~9 years ago without abnormalities.  Repeat due after 10 years.   Tobacco use: The patient denies current or previous tobacco use. Alcohol use: none  No drug use Exercise: moderately active  Has DEXA scheduled 5/23.  Has issues with SUI. Does kegels without relief. Drinks 1 caffeine drink daily. Has not done pelvic PT.  She does get adequate calcium and Vitamin D in her diet.  Labs with PCP.    Past Medical History:  Diagnosis Date   Graves disease    Hypothyroidism     Past Surgical History:  Procedure Laterality Date   CESAREAN SECTION      Family History  Problem Relation Age of Onset   Atrial fibrillation Mother    Heart disease Father    Parkinson's disease Father    Dementia  Father    Anemia Sister    Hypertension Sister    Breast cancer Sister [phone]   Hypertension Brother    Breast cancer Maternal Grandmother [phone]   Heart attack Maternal Grandfather    Pancreatic cancer Paternal Grandmother    Heart attack Paternal Grandfather    Breast cancer Paternal Aunt [phone]   Breast cancer Cousin 40   Hypertension Other     Social History   Socioeconomic History   Marital status: Married    Spouse name: Not on file   Number of children: Not on file   Years of education: Not on file   Highest education level: Not on file  Occupational History   Not on file  Tobacco Use   Smoking status: Never   Smokeless tobacco: Never  Vaping Use   Vaping Use: Never used  Substance and Sexual Activity   Alcohol use: Yes    Comment: occ   Drug use: No   Sexual activity: Yes    Birth control/protection: Post-menopausal  Other Topics Concern   Not on file  Social History Narrative   Not on file   Social Determinants of Health   Financial Resource Strain: Not on file  Food Insecurity: Not on file  Transportation Needs: Not on file  Physical Activity: Not on file  Stress: Not on file  Social Connections: Not on file  Intimate Partner Violence: Not on  file    No outpatient medications have been marked as taking for the 10/18/21 encounter (Appointment) with Fintan Grater, Ilona Sorrel, PA-C.      ROS:  Review of Systems  Constitutional:  Negative for fatigue, fever and unexpected weight change.  Respiratory:  Negative for cough, shortness of breath and wheezing.   Cardiovascular:  Negative for chest pain, palpitations and leg swelling.  Gastrointestinal:  Negative for blood in stool, constipation, diarrhea, nausea and vomiting.  Endocrine: Negative for cold intolerance, heat intolerance and polyuria.  Genitourinary:  Negative for dyspareunia, dysuria, flank pain, frequency, genital sores, hematuria, menstrual problem, pelvic pain, urgency, vaginal bleeding, vaginal discharge  and vaginal pain.  Musculoskeletal:  Negative for back pain, joint swelling and myalgias.  Skin:  Positive for rash.  Neurological:  Negative for dizziness, syncope, light-headedness, numbness and headaches.  Hematological:  Negative for adenopathy.  Psychiatric/Behavioral:  Negative for agitation, confusion, sleep disturbance and suicidal ideas. The patient is not nervous/anxious.     Objective: There were no vitals taken for this visit.   Physical Exam Constitutional:      Appearance: She is well-developed.  Genitourinary:     Vulva normal.     Genitourinary Comments: BILAT LABIA MINORA WITH PALE SKIN, BRUISING FROM TRAUMA NEAR CLITORIS; NO EVID OF LICHEN SCLEROSUS; POS VAGINAL ATROPHY     Right Labia: No rash, tenderness or lesions.    Left Labia: rash.     Left Labia: No tenderness or lesions.    No vaginal discharge, erythema or tenderness.      Right Adnexa: not tender and no mass present.    Left Adnexa: not tender and no mass present.    No cervical motion tenderness, friability or polyp.     Uterus is not enlarged or tender.  Breasts:    Right: No mass, nipple discharge, skin change or tenderness.     Left: No mass, nipple discharge, skin change or tenderness.  Neck:     Thyroid: No thyromegaly.  Cardiovascular:     Rate and Rhythm: Normal rate and regular rhythm.     Heart sounds: Normal heart sounds. No murmur heard. Pulmonary:     Effort: Pulmonary effort is normal.     Breath sounds: Normal breath sounds.  Abdominal:     Palpations: Abdomen is soft.     Tenderness: There is no abdominal tenderness. There is no guarding or rebound.  Musculoskeletal:        General: Normal range of motion.     Cervical back: Normal range of motion.  Lymphadenopathy:     Cervical: No cervical adenopathy.  Neurological:     General: No focal deficit present.     Mental Status: She is alert and oriented to person, place, and time.     Cranial Nerves: No cranial nerve deficit.   Skin:    General: Skin is warm and dry.  Psychiatric:        Mood and Affect: Mood normal.        Behavior: Behavior normal.        Thought Content: Thought content normal.        Judgment: Judgment normal.  Vitals reviewed.    Assessment/Plan:  Encounter for annual routine gynecological examination  Cervical cancer screening - Plan: Cytology - PAP  Screening for HPV (human papillomavirus) - Plan: Cytology - PAP  Encounter for screening mammogram for malignant neoplasm of breast; pt current on mammo.  Family history of breast cancer--MyRisk testing discussed and declined  today. F/u prn.  Chronic vaginitis - Plan: clotrimazole-betamethasone (LOTRISONE) cream; perianal area. Question fungal vs LS. Rx lotrisone crm BID for 2 wks. RTO for recheck in 2 wks. If sx persist, will do bx for LS.   SUI--cont kegels, d/c caffeine before exercise. Can refer to pelvic PT but pt wants to hold off for now.   No orders of the defined types were placed in this encounter.          GYN counsel mammography screening, menopause, adequate intake of calcium and vitamin D, diet and exercise    F/U  No follow-ups on file.  Tasmin Exantus B. Steward Sames, PA-C 10/16/2021 7:55 PM

## 2021-10-17 NOTE — Telephone Encounter (Signed)
Requested medications are due for refill today.  yes ? ?Requested medications are on the active medications list.  yes ? ?Last refill. 04/14/2021 #90 1 refill ? ?Future visit scheduled.   yes ? ?Notes to clinic.  Medication refill failed protocol due to expired labs. ? ? ? ?Requested Prescriptions  ?Pending Prescriptions Disp Refills  ? chlorthalidone (HYGROTON) 25 MG tablet [Pharmacy Med Name: Chlorthalidone 25 MG Oral Tablet] 90 tablet 0  ?  Sig: Take 1 tablet by mouth once daily  ?  ? Cardiovascular: Diuretics - Thiazide Failed - 10/15/2021  9:47 AM  ?  ?  Failed - Cr in normal range and within 180 days  ?  Creatinine, Ser  ?Date Value Ref Range Status  ?09/07/2020 0.96 0.57 - 1.00 mg/dL Final  ?  ?  ?  ?  Failed - K in normal range and within 180 days  ?  Potassium  ?Date Value Ref Range Status  ?09/07/2020 3.9 3.5 - 5.2 mmol/L Final  ?  ?  ?  ?  Failed - Na in normal range and within 180 days  ?  Sodium  ?Date Value Ref Range Status  ?09/07/2020 141 134 - 144 mmol/L Final  ?  ?  ?  ?  Passed - Last BP in normal range  ?  BP Readings from Last 1 Encounters:  ?09/08/21 134/87  ?  ?  ?  ?  Passed - Valid encounter within last 6 months  ?  Recent Outpatient Visits   ? ?      ? 1 month ago Welcome to [employer] preventive visit  ? [facility] Jerrol Banana., MD  ? 8 months ago Viral upper respiratory tract infection  ? [facility] Jerrol Banana., MD  ? 1 year ago Annual physical exam  ? [facility] Jerrol Banana., MD  ? 1 year ago Essential hypertension  ? [facility] Jerrol Banana., MD  ? 1 year ago Essential hypertension  ? [facility] Jerrol Banana., MD  ? ?  ?  ?Future Appointments   ? ?        ? In 4 weeks Jerrol Banana., MD [facility]  ? ?  ? ? ?  ?  ?  ?  ?

## 2021-10-18 ENCOUNTER — Ambulatory Visit: Payer: Medicare PPO | Admitting: Obstetrics and Gynecology

## 2021-10-18 DIAGNOSIS — Z01419 Encounter for gynecological examination (general) (routine) without abnormal findings: Secondary | ICD-10-CM

## 2021-10-18 DIAGNOSIS — N761 Subacute and chronic vaginitis: Secondary | ICD-10-CM

## 2021-10-18 DIAGNOSIS — Z803 Family history of malignant neoplasm of breast: Secondary | ICD-10-CM

## 2021-10-18 DIAGNOSIS — Z1231 Encounter for screening mammogram for malignant neoplasm of breast: Secondary | ICD-10-CM

## 2021-10-18 DIAGNOSIS — Z1211 Encounter for screening for malignant neoplasm of colon: Secondary | ICD-10-CM

## 2021-10-25 ENCOUNTER — Ambulatory Visit
Admission: RE | Admit: 2021-10-25 | Discharge: 2021-10-25 | Disposition: A | Discharge status: Home / Self Care | Payer: Medicare PPO | Source: Ambulatory Visit | Attending: Family Medicine | Admitting: Family Medicine

## 2021-10-25 DIAGNOSIS — Z8262 Family history of osteoporosis: Secondary | ICD-10-CM | POA: Diagnosis not present

## 2021-10-25 DIAGNOSIS — Z780 Asymptomatic menopausal state: Secondary | ICD-10-CM | POA: Diagnosis not present

## 2021-10-25 DIAGNOSIS — Z13820 Encounter for screening for osteoporosis: Secondary | ICD-10-CM | POA: Diagnosis not present

## 2021-10-25 DIAGNOSIS — M8589 Other specified disorders of bone density and structure, multiple sites: Secondary | ICD-10-CM | POA: Diagnosis not present

## 2021-11-09 ENCOUNTER — Other Ambulatory Visit: Payer: Self-pay

## 2021-11-09 DIAGNOSIS — I10 Essential (primary) hypertension: Secondary | ICD-10-CM

## 2021-11-09 DIAGNOSIS — E7800 Pure hypercholesterolemia, unspecified: Secondary | ICD-10-CM | POA: Diagnosis not present

## 2021-11-09 DIAGNOSIS — E039 Hypothyroidism, unspecified: Secondary | ICD-10-CM

## 2021-11-10 LAB — CBC WITH DIFFERENTIAL/PLATELET
Basophils Absolute: 0.1 10*3/uL (ref 0.0–0.2)
Basos: 2 %
EOS (ABSOLUTE): 0.4 10*3/uL (ref 0.0–0.4)
Eos: 7 %
Hematocrit: 41.2 % (ref 34.0–46.6)
Hemoglobin: 14.5 g/dL (ref 11.1–15.9)
Immature Grans (Abs): 0 10*3/uL (ref 0.0–0.1)
Immature Granulocytes: 0 %
Lymphocytes Absolute: 2.1 10*3/uL (ref 0.7–3.1)
Lymphs: 34 %
MCH: 35.1 pg — ABNORMAL HIGH (ref 26.6–33.0)
MCHC: 35.2 g/dL (ref 31.5–35.7)
MCV: 100 fL — ABNORMAL HIGH (ref 79–97)
Monocytes Absolute: 0.6 10*3/uL (ref 0.1–0.9)
Monocytes: 11 %
Neutrophils Absolute: 2.9 10*3/uL (ref 1.4–7.0)
Neutrophils: 46 %
Platelets: 309 10*3/uL (ref 150–450)
RBC: 4.13 x10E6/uL (ref 3.77–5.28)
RDW: 12.6 % (ref 11.7–15.4)
WBC: 6.1 10*3/uL (ref 3.4–10.8)

## 2021-11-10 LAB — COMPREHENSIVE METABOLIC PANEL
ALT: 16 IU/L (ref 0–32)
AST: 22 IU/L (ref 0–40)
Albumin/Globulin Ratio: 2 (ref 1.2–2.2)
Albumin: 4.6 g/dL (ref 3.8–4.8)
Alkaline Phosphatase: 73 IU/L (ref 44–121)
BUN/Creatinine Ratio: 13 (ref 12–28)
BUN: 12 mg/dL (ref 8–27)
Bilirubin Total: 0.6 mg/dL (ref 0.0–1.2)
CO2: 25 mmol/L (ref 20–29)
Calcium: 9.6 mg/dL (ref 8.7–10.3)
Chloride: 97 mmol/L (ref 96–106)
Creatinine, Ser: 0.94 mg/dL (ref 0.57–1.00)
Globulin, Total: 2.3 g/dL (ref 1.5–4.5)
Glucose: 92 mg/dL (ref 70–99)
Potassium: 3.5 mmol/L (ref 3.5–5.2)
Sodium: 138 mmol/L (ref 134–144)
Total Protein: 6.9 g/dL (ref 6.0–8.5)
eGFR: 67 mL/min/{1.73_m2} (ref >59)

## 2021-11-10 LAB — LIPID PANEL WITH LDL/HDL RATIO
Cholesterol, Total: 168 mg/dL (ref 100–199)
HDL: 31 mg/dL — ABNORMAL LOW (ref >39)
LDL Chol Calc (NIH): 94 mg/dL (ref 0–99)
LDL/HDL Ratio: 3 ratio (ref 0.0–3.2)
Triglycerides: 256 mg/dL — ABNORMAL HIGH (ref 0–149)
VLDL Cholesterol Cal: 43 mg/dL — ABNORMAL HIGH (ref 5–40)

## 2021-11-10 LAB — TSH: TSH: 5.3 u[IU]/mL — ABNORMAL HIGH (ref 0.450–4.500)

## 2021-11-14 ENCOUNTER — Other Ambulatory Visit: Payer: Self-pay

## 2021-11-14 DIAGNOSIS — E039 Hypothyroidism, unspecified: Secondary | ICD-10-CM

## 2021-11-14 MED ORDER — LEVOTHYROXINE SODIUM 100 MCG PO TABS: 100.0000 ug | ORAL_TABLET | Freq: Every day | ORAL | 3 refills | Status: DC

## 2021-11-14 NOTE — Progress Notes (Signed)
Established patient visit  I,[name],acting as a scribe for Megan Mans, MD.,have documented all relevant documentation on the behalf of Megan Mans, MD,as directed by  Megan Mans, MD while in the presence of Megan Mans, MD.   Patient: Tina Marshall   DOB: Sep 05, 1955   66 y.o. Female  MRN: [id number] Visit Date: 11/15/2021  Today's healthcare provider: Megan Mans, MD   Chief Complaint  Patient presents with   Follow-up   Subjective    HPI  Patient is here for follow up labs and pneumococcal vaccine. She feels well and has no complaints.  Has a history of B12 deficiency and we need to follow-up on her p.o. supplementation treatment.  She also has a history of hyperlipidemia.  Medications: Outpatient Medications Prior to Visit  Medication Sig   CALCIUM PO Take 600 mg by mouth 2 (two) times daily.   chlorthalidone (HYGROTON) 25 MG tablet Take 1 tablet by mouth once daily   clobetasol ointment (TEMOVATE) 0.05 % Apply pea-sized amount to affected area twice a week as maintenance   clotrimazole-betamethasone (LOTRISONE) cream Apply externally BID for 2 wks   Cranberry Fruit 475 MG CAPS Take by mouth.   levothyroxine (EUTHYROX) 100 MCG tablet Take 1 tablet (100 mcg total) by mouth daily before breakfast. TAKE 1 TABLET BY MOUTH BY MOUTH BEFORE BREAKFAST   metoprolol succinate (TOPROL-XL) 25 MG 24 hr tablet TAKE 1 TABLET BY MOUTH ONCE DAILY WITH OR IMMEDIATELY FOLLOWING A MEAL   Probiotic Product (PROBIOTIC ADVANCED PO) Take by mouth.   No facility-administered medications prior to visit.    Review of Systems  Constitutional:  Negative for appetite change, chills, fatigue and fever.  Respiratory:  Negative for chest tightness and shortness of breath.   Cardiovascular:  Negative for chest pain and palpitations.  Gastrointestinal:  Negative for abdominal pain, nausea and vomiting.  Neurological:  Negative for dizziness and weakness.    Last thyroid functions Lab Results  Component Value Date   TSH 5.300 (H) 11/09/2021       Objective    BP 115/74 (BP Location: Right Arm, Patient Position: Sitting, Cuff Size: Normal)   Pulse 74   Resp 16   Wt 131 lb (59.4 kg)   SpO2 96%   BMI 24.75 kg/m  BP Readings from Last 3 Encounters:  11/15/21 115/74  09/08/21 134/87  11/09/20 130/80   Wt Readings from Last 3 Encounters:  11/15/21 131 lb (59.4 kg)  09/08/21 132 lb 8 oz (60.1 kg)  11/09/20 132 lb (59.9 kg)      Physical Exam Vitals reviewed.  Constitutional:      General: She is not in acute distress.    Appearance: She is well-developed.  HENT:     Head: Normocephalic and atraumatic.     Right Ear: Hearing normal.     Left Ear: Hearing normal.     Nose: Nose normal.  Eyes:     General: Lids are normal. No scleral icterus.       Right eye: No discharge.        Left eye: No discharge.     Conjunctiva/sclera: Conjunctivae normal.  Cardiovascular:     Rate and Rhythm: Normal rate and regular rhythm.     Heart sounds: Normal heart sounds.  Pulmonary:     Effort: Pulmonary effort is normal. No respiratory distress.  Skin:    Findings: No lesion or rash.  Neurological:  General: No focal deficit present.     Mental Status: She is alert and oriented to person, place, and time.  Psychiatric:        Mood and Affect: Mood normal.        Speech: Speech normal.        Behavior: Behavior normal.        Thought Content: Thought content normal.        Judgment: Judgment normal.       No results found for any visits on 11/15/21.  Assessment & Plan     1. Adult hypothyroidism   2. Essential hypertension   3. Elevated LDL cholesterol level She will get labs done before next visit.  4. Need for pneumococcal vaccination  - Pneumococcal conjugate vaccine 20-valent (Prevnar 20)  5. Major depressive disorder with single episode, in full remission (HCC)    Return in about 3 months (around  02/15/2022).      I, Megan Mans, MD, have reviewed all documentation for this visit. The documentation on 11/21/21 for the exam, diagnosis, procedures, and orders are all accurate and complete. '   Ronte Parker Wendelyn Breslow, MD  [facility] [phone] (phone) [phone] (fax)  [facility]

## 2021-11-15 ENCOUNTER — Ambulatory Visit: Payer: Medicare PPO | Admitting: Family Medicine

## 2021-11-15 ENCOUNTER — Encounter: Payer: Self-pay | Admitting: Family Medicine

## 2021-11-15 VITALS — BP 115/74 | HR 74 | Resp 16 | Wt 131.0 lb

## 2021-11-15 DIAGNOSIS — E7800 Pure hypercholesterolemia, unspecified: Secondary | ICD-10-CM

## 2021-11-15 DIAGNOSIS — Z23 Encounter for immunization: Secondary | ICD-10-CM

## 2021-11-15 DIAGNOSIS — I10 Essential (primary) hypertension: Secondary | ICD-10-CM

## 2021-11-15 DIAGNOSIS — F325 Major depressive disorder, single episode, in full remission: Secondary | ICD-10-CM | POA: Diagnosis not present

## 2021-11-15 DIAGNOSIS — E039 Hypothyroidism, unspecified: Secondary | ICD-10-CM

## 2021-11-15 NOTE — Patient Instructions (Signed)
CONSIDER TRYING VITAMIN B12, FOLATE AND VITAMIN D DAILY.

## 2021-12-06 ENCOUNTER — Encounter: Payer: Self-pay | Admitting: Obstetrics and Gynecology

## 2021-12-06 ENCOUNTER — Ambulatory Visit (INDEPENDENT_AMBULATORY_CARE_PROVIDER_SITE_OTHER): Payer: Medicare PPO | Admitting: Obstetrics and Gynecology

## 2021-12-06 VITALS — BP 120/80 | Ht 61.0 in | Wt 130.0 lb

## 2021-12-06 DIAGNOSIS — Z1231 Encounter for screening mammogram for malignant neoplasm of breast: Secondary | ICD-10-CM | POA: Diagnosis not present

## 2021-12-06 DIAGNOSIS — Z803 Family history of malignant neoplasm of breast: Secondary | ICD-10-CM

## 2021-12-06 DIAGNOSIS — N761 Subacute and chronic vaginitis: Secondary | ICD-10-CM

## 2021-12-06 DIAGNOSIS — Z01419 Encounter for gynecological examination (general) (routine) without abnormal findings: Secondary | ICD-10-CM

## 2021-12-06 DIAGNOSIS — L900 Lichen sclerosus et atrophicus: Secondary | ICD-10-CM | POA: Diagnosis not present

## 2021-12-06 DIAGNOSIS — L309 Dermatitis, unspecified: Secondary | ICD-10-CM

## 2021-12-06 DIAGNOSIS — N393 Stress incontinence (female) (male): Secondary | ICD-10-CM

## 2021-12-06 MED ORDER — CLOBETASOL PROPIONATE 0.05 % EX OINT: TOPICAL_OINTMENT | CUTANEOUS | 1 refills | Status: DC

## 2022-01-09 ENCOUNTER — Other Ambulatory Visit: Payer: Self-pay | Admitting: Family Medicine

## 2022-01-10 NOTE — Telephone Encounter (Signed)
Requested Prescriptions  Pending Prescriptions Disp Refills  . chlorthalidone (HYGROTON) 25 MG tablet [Pharmacy Med Name: Chlorthalidone 25 MG Oral Tablet] 90 tablet 0    Sig: Take 1 tablet by mouth once daily     Cardiovascular: Diuretics - Thiazide Passed - 01/09/2022  3:03 PM      Passed - Cr in normal range and within 180 days    Creatinine, Ser  Date Value Ref Range Status  11/09/2021 0.94 0.57 - 1.00 mg/dL Final         Passed - K in normal range and within 180 days    Potassium  Date Value Ref Range Status  11/09/2021 3.5 3.5 - 5.2 mmol/L Final         Passed - Na in normal range and within 180 days    Sodium  Date Value Ref Range Status  11/09/2021 138 134 - 144 mmol/L Final         Passed - Last BP in normal range    BP Readings from Last 1 Encounters:  12/06/21 120/80         Passed - Valid encounter within last 6 months    Recent Outpatient Visits          1 month ago Adult hypothyroidism   [facility] Maple Hudson., MD   4 months ago Welcome to [employer] preventive visit   [facility] Maple Hudson., MD   11 months ago Viral upper respiratory tract infection   [facility] Maple Hudson., MD   1 year ago Annual physical exam   [facility] Maple Hudson., MD   1 year ago Essential hypertension   [facility] Maple Hudson., MD      Future Appointments            In 1 month Maple Hudson., MD [facility]

## 2022-01-24 ENCOUNTER — Telehealth: Payer: Self-pay

## 2022-01-24 NOTE — Telephone Encounter (Signed)
Copied from CRM [id number]. Topic: General - Other >> Jan 24, 2022 12:50 PM Tiffany B wrote: Reason for CRM: patient stated PCP wanted her to have labs done a week prior to her 03/01/2022 follow up appointment. Patient would like a follow up call regarding lab orders.

## 2022-01-24 NOTE — Telephone Encounter (Signed)
Patient needs TSH rechecked. Patient wants to know if she needs to be seen or if this can be a lab only?

## 2022-01-27 ENCOUNTER — Encounter: Payer: Self-pay | Admitting: Family Medicine

## 2022-02-01 ENCOUNTER — Other Ambulatory Visit: Payer: Self-pay | Admitting: Obstetrics and Gynecology

## 2022-02-01 DIAGNOSIS — L900 Lichen sclerosus et atrophicus: Secondary | ICD-10-CM

## 2022-02-20 ENCOUNTER — Telehealth: Payer: Self-pay

## 2022-02-20 NOTE — Telephone Encounter (Signed)
Copied from CRM [id number]. Topic: General - Other >> Feb 20, 2022 11:23 AM Everette C wrote: Reason for CRM: The patient would like to speak with a member of clinical staff regarding their previously requested orders for lab work   The patient is scheduled to see Dr. Sullivan Lone on 03/01/22  Please contact the patient further when possible

## 2022-02-21 ENCOUNTER — Telehealth: Payer: Self-pay

## 2022-02-21 ENCOUNTER — Other Ambulatory Visit: Payer: Self-pay

## 2022-02-21 DIAGNOSIS — E039 Hypothyroidism, unspecified: Secondary | ICD-10-CM

## 2022-02-21 NOTE — Telephone Encounter (Signed)
Copied from CRM [id number]. Topic: General - Other >> Feb 21, 2022 10:52 AM Everette C wrote: Reason for CRM: The patient has caled to follow up on their previous request for lab work   Please contact further when possible

## 2022-02-22 DIAGNOSIS — E039 Hypothyroidism, unspecified: Secondary | ICD-10-CM | POA: Diagnosis not present

## 2022-02-22 NOTE — Telephone Encounter (Signed)
Lab ordered and patient was advised.

## 2022-02-23 LAB — TSH: TSH: 0.367 u[IU]/mL — ABNORMAL LOW (ref 0.450–4.500)

## 2022-02-28 NOTE — Progress Notes (Signed)
I,Sulibeya S Dimas,acting as a scribe for [name], MD.,have documented all relevant documentation on the behalf of [name], MD,as directed by  [name], MD while in the presence of [name], MD.    Established patient visit   Patient: Tina Marshall   DOB: [birth year]   66 y.o. Female  MRN: [id number] Visit Date: 03/01/2022  Today's healthcare provider: [name], MD   Chief Complaint  Patient presents with   Hypertension   Hypothyroidism   Headache    Patient reports she is under stress, due to her mother being ill.    Subjective    HPI Patient comes in today for follow-up.  A lot of family issues with medical problems and aging parents.  Adjusting her thyroid medication.  HPI     Headache           Location Descriptor: In the frontal area   Quality: throbbing   Onset: 2 days   Duration: hours   Timing: throughout the day and with stress   Course: gradually worsening   Treatments Attempted: analgesics   Response to treatment: no improvement   Associated Symptoms: Negative for blurred vision, vision loss and double vision   Comments: Patient reports she is under stress, due to her mother being ill.        Last edited by Dorian Pod, CMA on 03/01/2022  8:50 AM.      Hypertension, follow-up  BP Readings from Last 3 Encounters:  12/06/21 120/80  11/15/21 115/74  09/08/21 134/87   Wt Readings from Last 3 Encounters:  12/06/21 130 lb (59 kg)  11/15/21 131 lb (59.4 kg)  09/08/21 132 lb 8 oz (60.1 kg)     She was last seen for hypertension 3 months ago.  BP at that visit was 115/74. Management since that visit includes no changes.  She reports excellent compliance with treatment. She is not having side effects.  She is following a Regular diet. She is exercising. She does not smoke.  Use of agents associated with hypertension: none.   Outside blood pressures are not being checked. Symptoms: No  chest pain No chest pressure  No palpitations No syncope  No dyspnea No orthopnea  No paroxysmal nocturnal dyspnea No lower extremity edema   Pertinent labs Lab Results  Component Value Date   CHOL 168 11/09/2021   HDL 31 (L) 11/09/2021   LDLCALC 94 11/09/2021   TRIG 256 (H) 11/09/2021   CHOLHDL 5.0 (H) 10/14/2020   Lab Results  Component Value Date   NA 138 11/09/2021   K 3.5 11/09/2021   CREATININE 0.94 11/09/2021   EGFR 67 11/09/2021   GLUCOSE 92 11/09/2021   TSH 0.367 (L) 02/22/2022     The 10-year ASCVD risk score (Arnett DK, et al., 2019) is: 7.6%  --------------------------------------------------------------------------------------------------- Hypothyroid, follow-up  Lab Results  Component Value Date   TSH 0.367 (L) 02/22/2022   TSH 5.300 (H) 11/09/2021   TSH 2.550 10/14/2020    Wt Readings from Last 3 Encounters:  12/06/21 130 lb (59 kg)  11/15/21 131 lb (59.4 kg)  09/08/21 132 lb 8 oz (60.1 kg)   She was last seen for hypothyroid 4 months ago.  Management since that visit includes increase synthroid from 45mg to 103m daily. She reports excellent compliance with treatment. She is not having side effects.   Symptoms: No change in energy level No constipation  No diarrhea No heat / cold intolerance  No nervousness No palpitations  No weight changes   ----------------------------------------------------------------------------------------- Medications: Outpatient Medications Prior to Visit  Medication Sig   CALCIUM PO Take 600 mg by mouth 2 (two) times daily.   chlorthalidone (HYGROTON) 25 MG tablet Take 1 tablet by mouth once daily   clobetasol ointment (TEMOVATE) 0.05 % Apply pea-sized amount to affected area twice a week as maintenance   clotrimazole-betamethasone (LOTRISONE) cream Apply externally BID for 2 wks   levothyroxine (EUTHYROX) 100 MCG tablet Take 1 tablet (100 mcg total) by mouth daily before breakfast. TAKE 1 TABLET BY MOUTH BY MOUTH  BEFORE BREAKFAST   metoprolol succinate (TOPROL-XL) 25 MG 24 hr tablet TAKE 1 TABLET BY MOUTH ONCE DAILY WITH OR IMMEDIATELY FOLLOWING A MEAL   Probiotic Product (PROBIOTIC ADVANCED PO) Take by mouth.   [DISCONTINUED] Cranberry Fruit 475 MG CAPS Take by mouth. (Patient not taking: Reported on 03/01/2022)   No facility-administered medications prior to visit.    Review of Systems  Constitutional:  Negative for appetite change, chills, fatigue and fever.  Eyes:  Negative for visual disturbance.  Respiratory:  Negative for chest tightness and shortness of breath.   Cardiovascular:  Negative for chest pain and palpitations.  Gastrointestinal:  Negative for abdominal pain, nausea and vomiting.  Neurological:  Positive for headaches. Negative for dizziness and weakness.    Last lipids Lab Results  Component Value Date   CHOL 168 11/09/2021   HDL 31 (L) 11/09/2021   LDLCALC 94 11/09/2021   TRIG 256 (H) 11/09/2021   CHOLHDL 5.0 (H) 10/14/2020       Objective    There were no vitals taken for this visit. BP Readings from Last 3 Encounters:  12/06/21 120/80  11/15/21 115/74  09/08/21 134/87   Wt Readings from Last 3 Encounters:  12/06/21 130 lb (59 kg)  11/15/21 131 lb (59.4 kg)  09/08/21 132 lb 8 oz (60.1 kg)      Physical Exam Vitals reviewed.  Constitutional:      General: She is not in acute distress.    Appearance: She is well-developed.  HENT:     Head: Normocephalic and atraumatic.     Right Ear: Hearing normal.     Left Ear: Hearing normal.     Nose: Nose normal.  Eyes:     General: Lids are normal. No scleral icterus.       Right eye: No discharge.        Left eye: No discharge.     Conjunctiva/sclera: Conjunctivae normal.  Cardiovascular:     Rate and Rhythm: Normal rate and regular rhythm.     Heart sounds: Normal heart sounds.  Pulmonary:     Effort: Pulmonary effort is normal. No respiratory distress.  Skin:    Findings: No lesion or rash.   Neurological:     General: No focal deficit present.     Mental Status: She is alert and oriented to person, place, and time.  Psychiatric:        Mood and Affect: Mood normal.        Speech: Speech normal.        Behavior: Behavior normal.        Thought Content: Thought content normal.        Judgment: Judgment normal.       No results found for any visits on 03/01/22.  Assessment & Plan     1. Adult hypothyroidism Decrease Synthroid to 88 mcg and recheck in 3 to 4 months.  2. Essential hypertension Excellent blood pressure control   No follow-ups on file.      I, [name], MD, have reviewed all documentation for this visit. The documentation on 03/04/22 for the exam, diagnosis, procedures, and orders are all accurate and complete.    Bhavika Schnider Cranford Mon, MD  [facility] [phone] (phone) [phone] (fax)  Mobile

## 2022-03-01 ENCOUNTER — Ambulatory Visit: Payer: Medicare PPO | Admitting: Family Medicine

## 2022-03-01 DIAGNOSIS — E039 Hypothyroidism, unspecified: Secondary | ICD-10-CM | POA: Diagnosis not present

## 2022-03-01 DIAGNOSIS — I10 Essential (primary) hypertension: Secondary | ICD-10-CM

## 2022-03-01 MED ORDER — LEVOTHYROXINE SODIUM 88 MCG PO TABS: 88.0000 ug | ORAL_TABLET | Freq: Every day | ORAL | 3 refills | Status: DC

## 2022-03-22 DIAGNOSIS — D2262 Melanocytic nevi of left upper limb, including shoulder: Secondary | ICD-10-CM | POA: Diagnosis not present

## 2022-03-22 DIAGNOSIS — D225 Melanocytic nevi of trunk: Secondary | ICD-10-CM | POA: Diagnosis not present

## 2022-03-22 DIAGNOSIS — D2261 Melanocytic nevi of right upper limb, including shoulder: Secondary | ICD-10-CM | POA: Diagnosis not present

## 2022-03-22 DIAGNOSIS — D485 Neoplasm of uncertain behavior of skin: Secondary | ICD-10-CM | POA: Diagnosis not present

## 2022-03-22 DIAGNOSIS — L820 Inflamed seborrheic keratosis: Secondary | ICD-10-CM | POA: Diagnosis not present

## 2022-03-22 DIAGNOSIS — D2272 Melanocytic nevi of left lower limb, including hip: Secondary | ICD-10-CM | POA: Diagnosis not present

## 2022-03-22 DIAGNOSIS — L821 Other seborrheic keratosis: Secondary | ICD-10-CM | POA: Diagnosis not present

## 2022-03-22 DIAGNOSIS — L538 Other specified erythematous conditions: Secondary | ICD-10-CM | POA: Diagnosis not present

## 2022-03-22 DIAGNOSIS — D2271 Melanocytic nevi of right lower limb, including hip: Secondary | ICD-10-CM | POA: Diagnosis not present

## 2022-04-14 ENCOUNTER — Other Ambulatory Visit: Payer: Self-pay | Admitting: Family Medicine

## 2022-04-14 MED ORDER — CHLORTHALIDONE 25 MG PO TABS: 25.0000 mg | ORAL_TABLET | Freq: Every day | ORAL | 0 refills | Status: DC

## 2022-04-14 NOTE — Telephone Encounter (Signed)
Requested medication (s) are due for refill today: yes  Requested medication (s) are on the active medication list: yes  Last refill:  01/10/22  Future visit scheduled: no  Notes to clinic:  Unable to refill per protocol, last refill by provider no longer at practice.     Requested Prescriptions  Pending Prescriptions Disp Refills   chlorthalidone (HYGROTON) 25 MG tablet 90 tablet 0    Sig: Take 1 tablet (25 mg total) by mouth daily.     Cardiovascular: Diuretics - Thiazide Passed - 04/14/2022 10:13 AM      Passed - Cr in normal range and within 180 days    Creatinine, Ser  Date Value Ref Range Status  11/09/2021 0.94 0.57 - 1.00 mg/dL Final         Passed - K in normal range and within 180 days    Potassium  Date Value Ref Range Status  11/09/2021 3.5 3.5 - 5.2 mmol/L Final         Passed - Na in normal range and within 180 days    Sodium  Date Value Ref Range Status  11/09/2021 138 134 - 144 mmol/L Final         Passed - Last BP in normal range    BP Readings from Last 1 Encounters:  12/06/21 120/80         Passed - Valid encounter within last 6 months    Recent Outpatient Visits           1 month ago Adult hypothyroidism   [facility] Jerrol Banana., MD   5 months ago Adult hypothyroidism   [facility] Jerrol Banana., MD   7 months ago [facility] to [facility] preventive visit   [facility] Jerrol Banana., MD   1 year ago Viral upper respiratory tract infection   [facility]   1 year ago Annual physical exam   [facility] Jerrol Banana., MD

## 2022-04-14 NOTE — Telephone Encounter (Signed)
Medication Refill - Medication:  chlorthalidone (HYGROTON) 25 MG tablet   Has the patient contacted their pharmacy? No.  (Agent: If yes, when and what did the pharmacy advise?) no response from provider  Preferred Pharmacy (with phone number or street name):  [place], [place] Phone: [phone]  Fax: [phone]     Has the patient been seen for an appointment in the last year OR does the patient have an upcoming appointment? Yes.    Agent: Please be advised that RX refills may take up to 3 business days. We ask that you follow-up with your pharmacy.

## 2022-05-08 ENCOUNTER — Ambulatory Visit: Payer: Medicare PPO | Admitting: Family Medicine

## 2022-05-08 NOTE — Progress Notes (Deleted)
      Established patient visit   Patient: Tina Marshall   DOB: 03/26/1956   66 y.o. Female  MRN: [id number] Visit Date: 05/08/2022  Today's healthcare provider: Mila Merry, MD   No chief complaint on file.  Subjective    HPI  ***  Medications: Outpatient Medications Prior to Visit  Medication Sig   CALCIUM PO Take 600 mg by mouth 2 (two) times daily.   chlorthalidone (HYGROTON) 25 MG tablet Take 1 tablet (25 mg total) by mouth daily.   clobetasol ointment (TEMOVATE) 0.05 % Apply pea-sized amount to affected area twice a week as maintenance   clotrimazole-betamethasone (LOTRISONE) cream Apply externally BID for 2 wks   levothyroxine (SYNTHROID) 88 MCG tablet Take 1 tablet (88 mcg total) by mouth daily.   metoprolol succinate (TOPROL-XL) 25 MG 24 hr tablet TAKE 1 TABLET BY MOUTH ONCE DAILY WITH OR IMMEDIATELY FOLLOWING A MEAL   Probiotic Product (PROBIOTIC ADVANCED PO) Take by mouth.   No facility-administered medications prior to visit.    Review of Systems  Constitutional:  Positive for fever. Negative for appetite change, chills and fatigue.  HENT:  Positive for sore throat.   Respiratory:  Positive for cough. Negative for chest tightness and shortness of breath.   Cardiovascular:  Negative for chest pain and palpitations.  Gastrointestinal:  Negative for abdominal pain, nausea and vomiting.  Neurological:  Negative for dizziness and weakness.    {Labs  Heme  Chem  Endocrine  Serology  Results Review (optional):23779}   Objective    There were no vitals taken for this visit. {Show previous vital signs (optional):23777}  Physical Exam  ***  No results found for any visits on 05/08/22.  Assessment & Plan     ***  No follow-ups on file.      {provider attestation***:1}   Mila Merry, MD  [facility] [phone] (phone) [phone] (fax)  [facility]

## 2022-05-12 ENCOUNTER — Encounter: Payer: Self-pay | Admitting: Physician Assistant

## 2022-05-12 ENCOUNTER — Ambulatory Visit
Admission: RE | Admit: 2022-05-12 | Discharge: 2022-05-12 | Disposition: A | Discharge status: Home / Self Care | Payer: Medicare PPO | Attending: Physician Assistant | Admitting: Physician Assistant

## 2022-05-12 ENCOUNTER — Ambulatory Visit
Admission: RE | Admit: 2022-05-12 | Discharge: 2022-05-12 | Disposition: A | Discharge status: Home / Self Care | Payer: Medicare PPO | Source: Ambulatory Visit | Attending: Physician Assistant | Admitting: Physician Assistant

## 2022-05-12 ENCOUNTER — Ambulatory Visit (INDEPENDENT_AMBULATORY_CARE_PROVIDER_SITE_OTHER): Payer: Medicare PPO | Admitting: Physician Assistant

## 2022-05-12 VITALS — BP 130/77 | HR 78 | Temp 97.9°F | Wt 125.9 lb

## 2022-05-12 DIAGNOSIS — R0989 Other specified symptoms and signs involving the circulatory and respiratory systems: Secondary | ICD-10-CM | POA: Insufficient documentation

## 2022-05-12 DIAGNOSIS — R059 Cough, unspecified: Secondary | ICD-10-CM | POA: Diagnosis not present

## 2022-05-12 DIAGNOSIS — R490 Dysphonia: Secondary | ICD-10-CM | POA: Diagnosis not present

## 2022-05-12 NOTE — Progress Notes (Unsigned)
I,Sha'taria Tyson,acting as a Neurosurgeon for [employer], PA-C.,have documented all relevant documentation on the behalf of Debera Lat, PA-C,as directed by  [employer], PA-C while in the presence of [employer], PA-C.   Established patient visit   Patient: Tina Marshall   DOB: 20-Aug-1955   66 y.o. Female  MRN: [id number] Visit Date: 05/12/2022  Today's healthcare provider: Debera Lat, PA-C   No chief complaint on file.  Subjective    Sore Throat  This is a new problem. The current episode started in the past 7 days. The problem has been unchanged (and additional symptoms). The patient is experiencing no pain. Associated symptoms include congestion, coughing, headaches and a hoarse voice. Associated symptoms comments: rhinnorhea. Treatments tried: tylenol cold and flu, mucinex, robitussin, tylenol. The treatment provided mild relief.    No covid test taken, no exposure to anyone sick  Medications: Outpatient Medications Prior to Visit  Medication Sig   CALCIUM PO Take 600 mg by mouth 2 (two) times daily.   chlorthalidone (HYGROTON) 25 MG tablet Take 1 tablet (25 mg total) by mouth daily.   clobetasol ointment (TEMOVATE) 0.05 % Apply pea-sized amount to affected area twice a week as maintenance   levothyroxine (SYNTHROID) 88 MCG tablet Take 1 tablet (88 mcg total) by mouth daily.   metoprolol succinate (TOPROL-XL) 25 MG 24 hr tablet TAKE 1 TABLET BY MOUTH ONCE DAILY WITH OR IMMEDIATELY FOLLOWING A MEAL   Probiotic Product (PROBIOTIC ADVANCED PO) Take by mouth.   clotrimazole-betamethasone (LOTRISONE) cream Apply externally BID for 2 wks (Patient not taking: Reported on 05/12/2022)   No facility-administered medications prior to visit.    Review of Systems  HENT:  Positive for congestion and hoarse voice.   Respiratory:  Positive for cough.   Neurological:  Positive for headaches.       Objective    BP 130/77 (BP Location: Left Arm, Patient Position: Sitting,  Cuff Size: Normal)   Pulse 78   Temp 97.9 F (36.6 C) (Oral)   Wt 125 lb 14.4 oz (57.1 kg)   SpO2 100%   BMI 23.79 kg/m    Physical Exam Vitals reviewed.  Constitutional:      General: She is not in acute distress.    Appearance: Normal appearance. She is well-developed. She is not diaphoretic.  HENT:     Head: Normocephalic and atraumatic.     Nose: Congestion present.  Eyes:     General: No scleral icterus.    Conjunctiva/sclera: Conjunctivae normal.  Neck:     Thyroid: No thyromegaly.  Cardiovascular:     Rate and Rhythm: Normal rate and regular rhythm.     Pulses: Normal pulses.     Heart sounds: Normal heart sounds. No murmur heard. Pulmonary:     Effort: Pulmonary effort is normal. No respiratory distress.     Breath sounds: Normal breath sounds. No wheezing, rhonchi or rales.  Musculoskeletal:     Cervical back: Neck supple.     Right lower leg: No edema.     Left lower leg: No edema.  Lymphadenopathy:     Cervical: No cervical adenopathy.  Skin:    General: Skin is warm and dry.     Findings: No rash.  Neurological:     Mental Status: She is alert and oriented to person, place, and time. Mental status is at baseline.  Psychiatric:        Mood and Affect: Mood normal.  Behavior: Behavior normal.      No results found for any visits on 05/12/22.  Assessment & Plan     1. Cough, unspecified type 2. Abnormal lung sounds 3. Hoarseness X 7 days Most likely due to URI, laryngitis, bronchitis Continue with symptomatic treatment including adequate hydration, air humidifier, hot tea with honey, warm salt gargle, nasal saline rinse, flonase However, ordered: - DG Chest 2 View; Future to rule out pneumonia , bronchitis Will reassess after receiving CXR results   The patient was advised to call back or seek an in-person evaluation if the symptoms worsen or if the condition fails to improve as anticipated.  I discussed the assessment and treatment plan  with the patient. The patient was provided an opportunity to ask questions and all were answered. The patient agreed with the plan and demonstrated an understanding of the instructions.  The entirety of the information documented in the History of Present Illness, Review of Systems and Physical Exam were personally obtained by me. Portions of this information were initially documented by the CMA and reviewed by me for thoroughness and accuracy.   Debera Lat, [facility], MMS [facility] [phone] (phone) [phone] (fax)

## 2022-05-15 NOTE — Progress Notes (Signed)
Please, let pt know that her lungs are clear, there are no acute abnormality which is reassuring. Please, continue with symptomatic treatment.  Please, schedule a FU if you will not see any changes this week.

## 2022-06-03 IMAGING — MG DIGITAL SCREENING BILAT W/ TOMO W/ CAD
6 of 10 series · 6 of 30 positions shown · non-contrast
Comparison: Previous exam(s).

CLINICAL DATA: Screening.

EXAM:
DIGITAL SCREENING BILATERAL MAMMOGRAM WITH TOMO AND CAD

[L CC synth-2D]
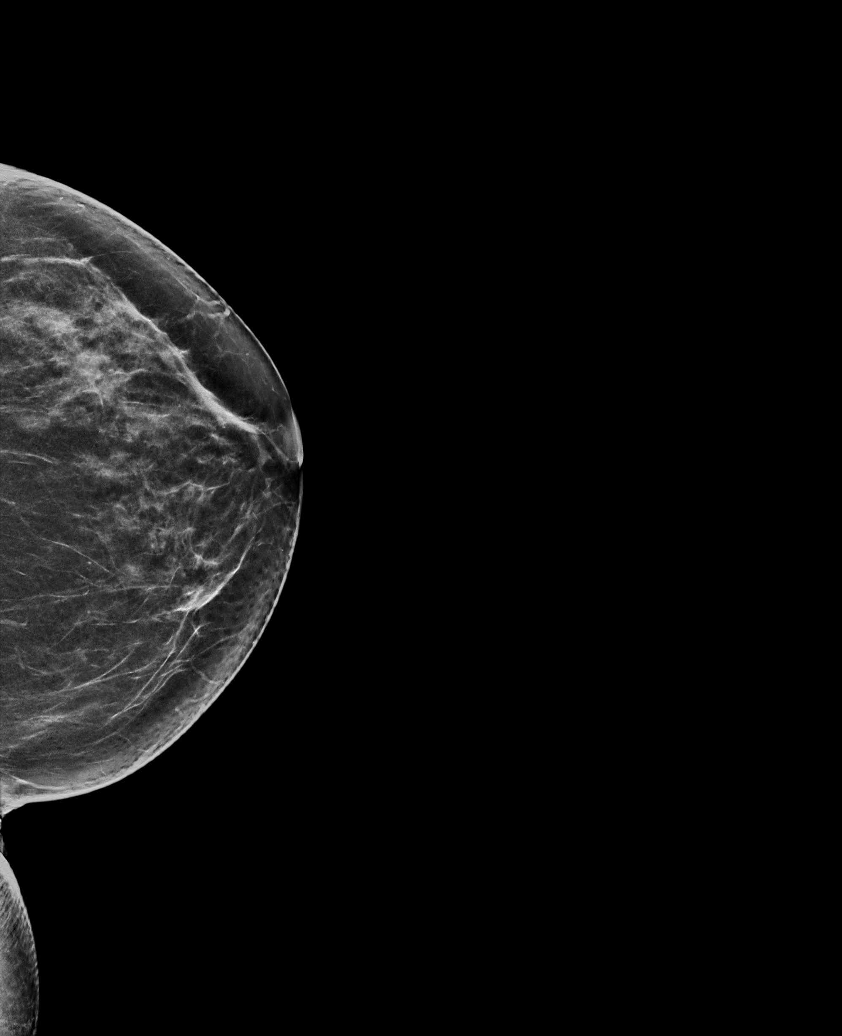

[R CC synth-2D (1 of 2)]
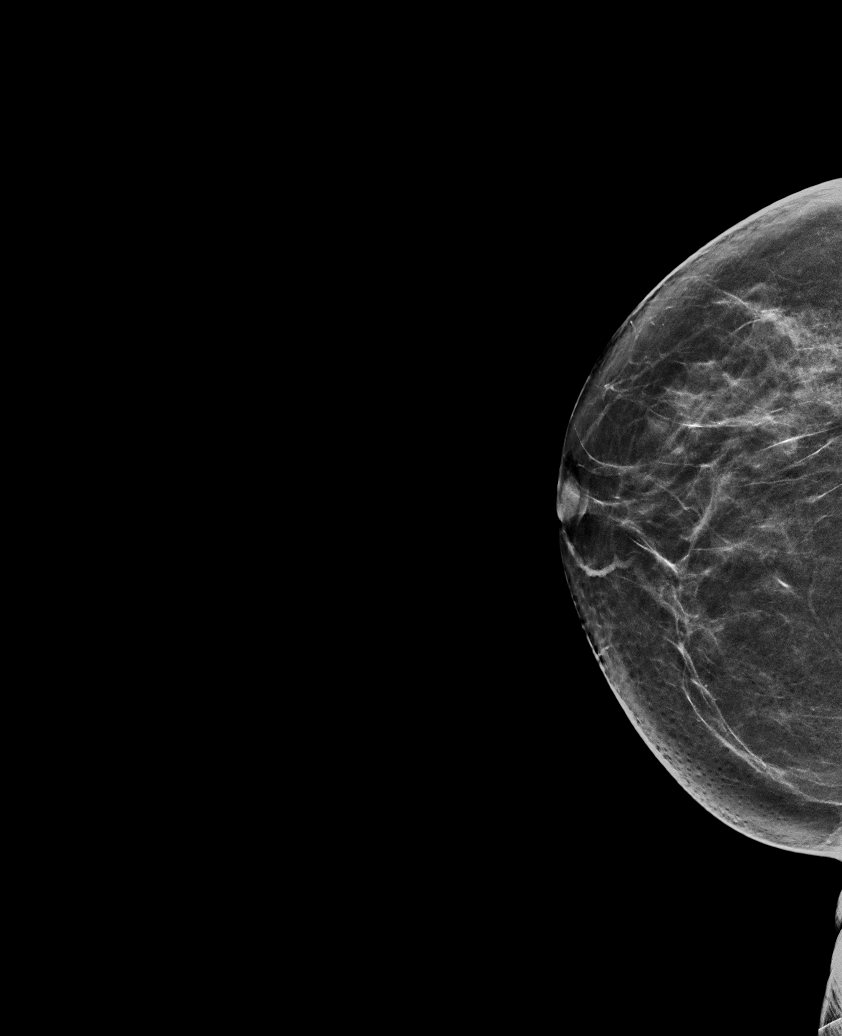

[R CC synth-2D (2 of 2)]
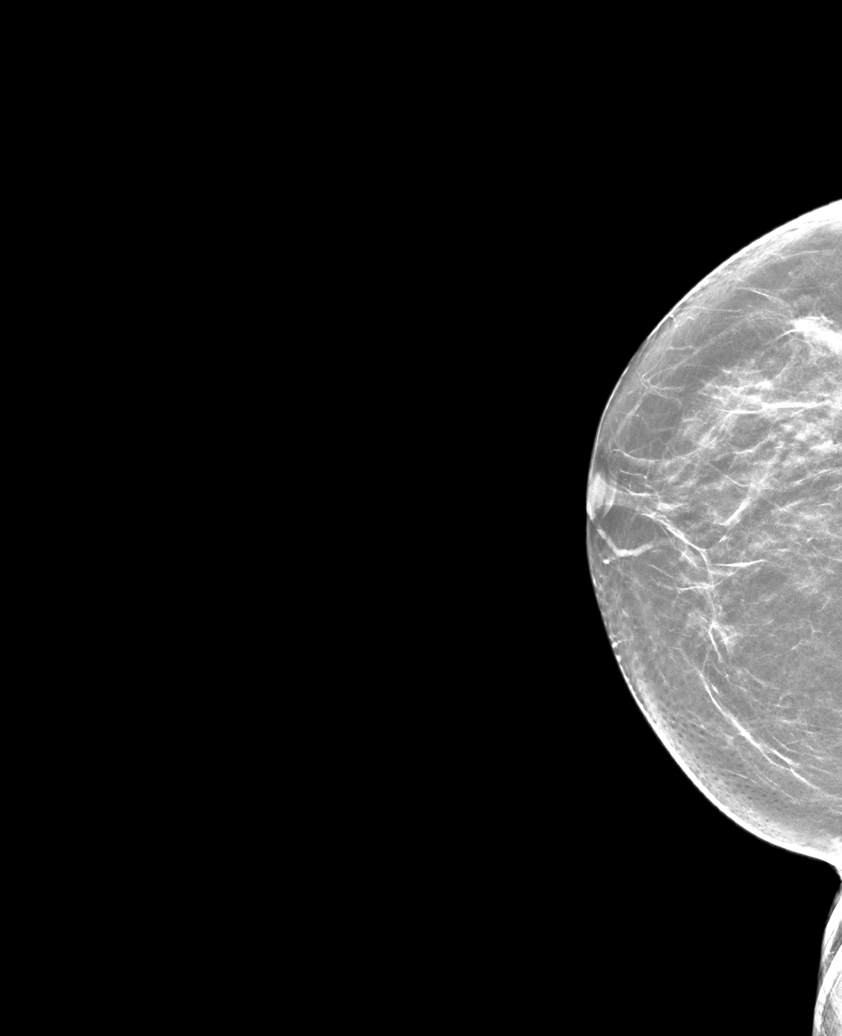

[R MLO synth-2D]
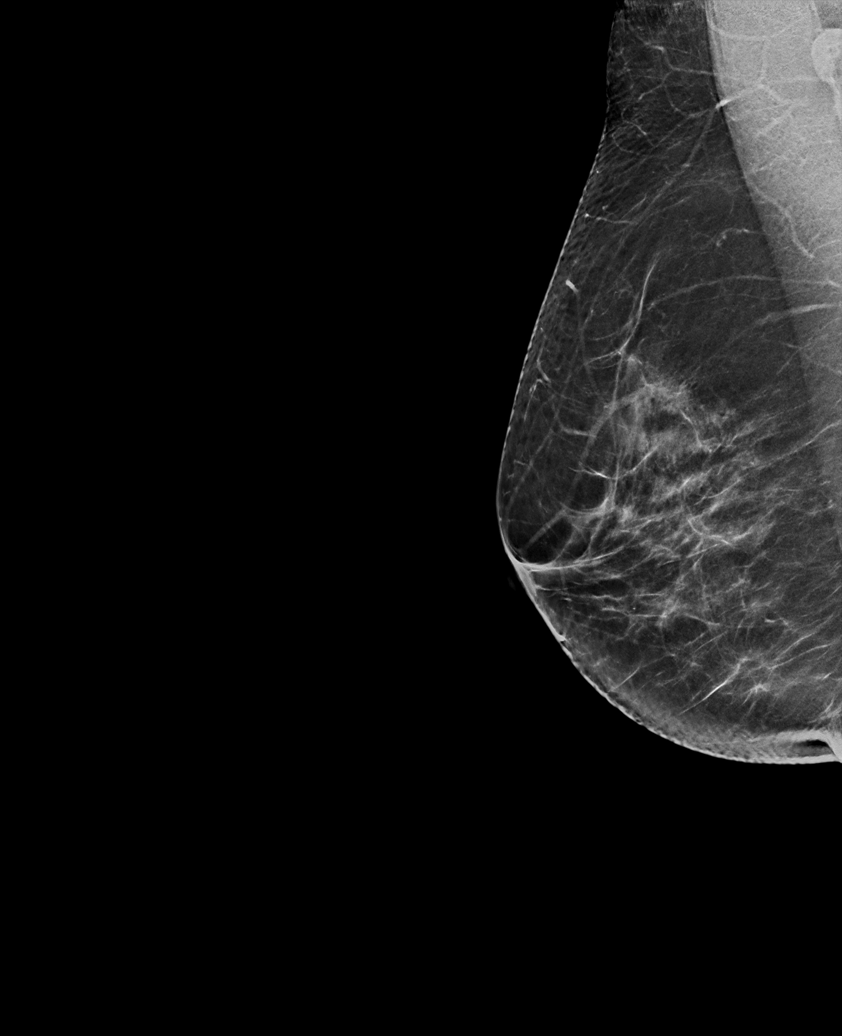

[L MLO synth-2D]
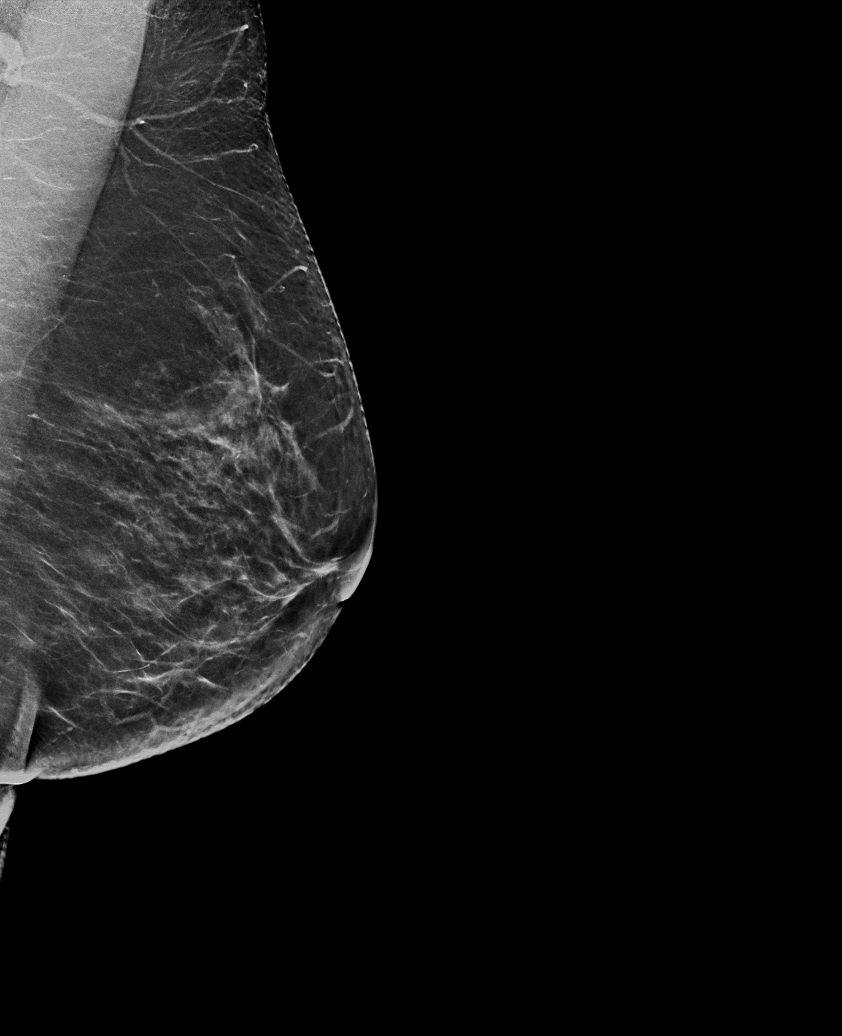

[L MLO tomo · tomo slice 35/68.0]
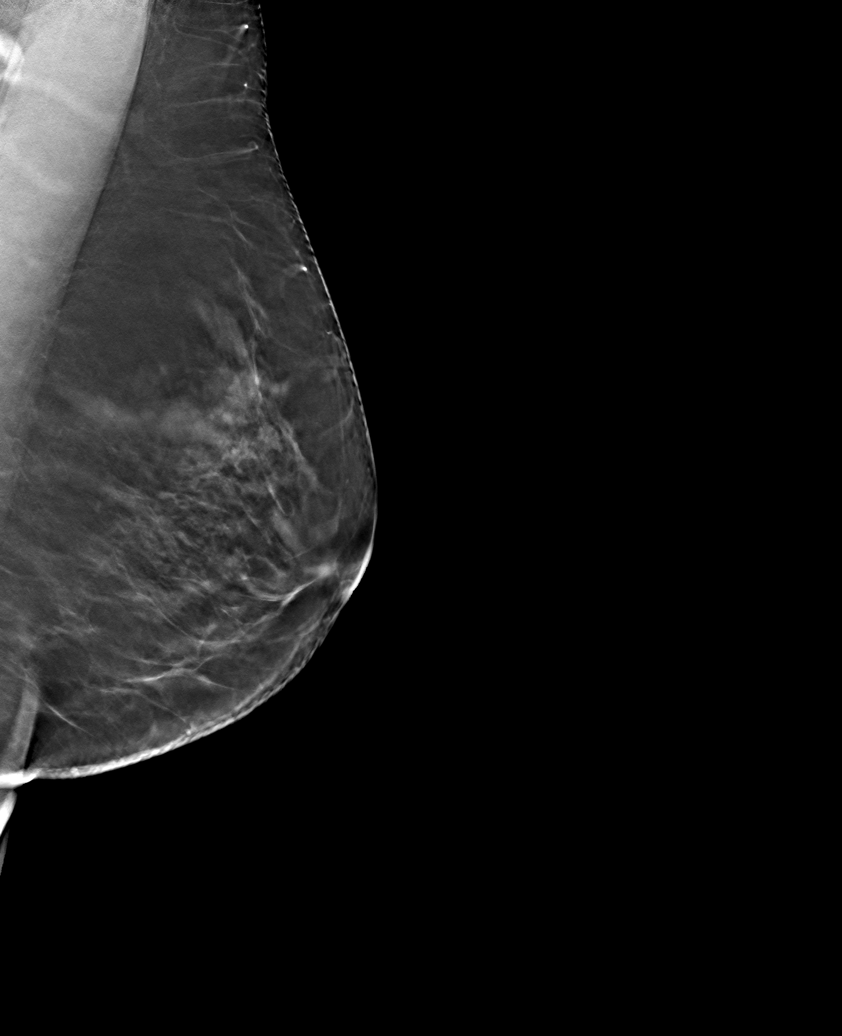

[6 of 30 positions shown; findings below may reference images not displayed]

ACR Breast Density Category b: There are scattered areas of
fibroglandular density.
FINDINGS: There are no findings suspicious for malignancy. Images were
processed with CAD.
IMPRESSION: No mammographic evidence of malignancy. A result letter of this
screening mammogram will be mailed directly to the patient.

RECOMMENDATION:
Screening mammogram in one year. (Code:[id number])

BI-RADS CATEGORY  1: Negative.

## 2022-07-09 ENCOUNTER — Other Ambulatory Visit: Payer: Self-pay | Admitting: Family Medicine

## 2022-07-10 NOTE — Telephone Encounter (Signed)
Requested medication (s) are due for refill today: yes  Requested medication (s) are on the active medication list: yes  Last refill:  04/14/22  Future visit scheduled: no  Notes to clinic:  Unable to refill per protocol, last refill by provider no longer at the practice, routing for review     Requested Prescriptions  Pending Prescriptions Disp Refills   chlorthalidone (HYGROTON) 25 MG tablet [Pharmacy Med Name: Chlorthalidone 25 MG Oral Tablet] 90 tablet 0    Sig: Take 1 tablet by mouth once daily     Cardiovascular: Diuretics - Thiazide Failed - 07/09/2022  8:37 AM      Failed - Cr in normal range and within 180 days    Creatinine, Ser  Date Value Ref Range Status  11/09/2021 0.94 0.57 - 1.00 mg/dL Final         Failed - K in normal range and within 180 days    Potassium  Date Value Ref Range Status  11/09/2021 3.5 3.5 - 5.2 mmol/L Final         Failed - Na in normal range and within 180 days    Sodium  Date Value Ref Range Status  11/09/2021 138 134 - 144 mmol/L Final         Passed - Last BP in normal range    BP Readings from Last 1 Encounters:  05/12/22 130/77         Passed - Valid encounter within last 6 months    Recent Outpatient Visits           1 month ago Cough, unspecified type   [place], [place], PA-C   4 months ago Adult hypothyroidism   [place] Jerrol Banana., MD   7 months ago Adult hypothyroidism   [place] Jerrol Banana., MD   10 months ago [employer] preventive visit   [facility] Jerrol Banana., MD   1 year ago Viral upper respiratory tract infection   [place] Jerrol Banana., MD

## 2022-08-01 ENCOUNTER — Other Ambulatory Visit: Payer: Self-pay | Admitting: Family Medicine

## 2022-08-01 DIAGNOSIS — Z1231 Encounter for screening mammogram for malignant neoplasm of breast: Secondary | ICD-10-CM

## 2022-08-29 ENCOUNTER — Ambulatory Visit
Admission: RE | Admit: 2022-08-29 | Discharge: 2022-08-29 | Disposition: A | Discharge status: Home / Self Care | Payer: Medicare PPO | Source: Ambulatory Visit | Attending: Family Medicine | Admitting: Family Medicine

## 2022-08-29 DIAGNOSIS — Z1231 Encounter for screening mammogram for malignant neoplasm of breast: Secondary | ICD-10-CM | POA: Diagnosis not present

## 2022-08-31 ENCOUNTER — Encounter: Payer: Self-pay | Admitting: Obstetrics and Gynecology

## 2022-08-31 DIAGNOSIS — Z1231 Encounter for screening mammogram for malignant neoplasm of breast: Secondary | ICD-10-CM

## 2022-08-31 DIAGNOSIS — R928 Other abnormal and inconclusive findings on diagnostic imaging of breast: Secondary | ICD-10-CM

## 2022-09-01 ENCOUNTER — Ambulatory Visit
Admission: RE | Admit: 2022-09-01 | Discharge: 2022-09-01 | Disposition: A | Discharge status: Home / Self Care | Payer: Medicare PPO | Source: Ambulatory Visit | Attending: Obstetrics and Gynecology | Admitting: Obstetrics and Gynecology

## 2022-09-01 DIAGNOSIS — R928 Other abnormal and inconclusive findings on diagnostic imaging of breast: Secondary | ICD-10-CM

## 2022-09-01 DIAGNOSIS — Z1231 Encounter for screening mammogram for malignant neoplasm of breast: Secondary | ICD-10-CM

## 2022-09-01 DIAGNOSIS — N6011 Diffuse cystic mastopathy of right breast: Secondary | ICD-10-CM | POA: Diagnosis not present

## 2022-09-07 ENCOUNTER — Encounter: Payer: Self-pay | Admitting: Family Medicine

## 2022-09-19 DIAGNOSIS — E7800 Pure hypercholesterolemia, unspecified: Secondary | ICD-10-CM | POA: Diagnosis not present

## 2022-09-19 DIAGNOSIS — I10 Essential (primary) hypertension: Secondary | ICD-10-CM | POA: Diagnosis not present

## 2022-09-19 DIAGNOSIS — E039 Hypothyroidism, unspecified: Secondary | ICD-10-CM | POA: Diagnosis not present

## 2022-09-19 DIAGNOSIS — Z0000 Encounter for general adult medical examination without abnormal findings: Secondary | ICD-10-CM | POA: Diagnosis not present

## 2022-10-16 DIAGNOSIS — M19171 Post-traumatic osteoarthritis, right ankle and foot: Secondary | ICD-10-CM | POA: Diagnosis not present

## 2022-12-21 ENCOUNTER — Encounter: Payer: Self-pay | Admitting: Obstetrics and Gynecology

## 2022-12-21 ENCOUNTER — Other Ambulatory Visit: Payer: Self-pay

## 2022-12-21 DIAGNOSIS — L900 Lichen sclerosus et atrophicus: Secondary | ICD-10-CM

## 2022-12-21 MED ORDER — CLOBETASOL PROPIONATE 0.05 % EX OINT: TOPICAL_OINTMENT | CUTANEOUS | 0 refills | Status: DC

## 2023-01-29 ENCOUNTER — Ambulatory Visit (INDEPENDENT_AMBULATORY_CARE_PROVIDER_SITE_OTHER): Payer: Medicare PPO | Admitting: Obstetrics and Gynecology

## 2023-01-29 ENCOUNTER — Encounter: Payer: Self-pay | Admitting: Obstetrics and Gynecology

## 2023-01-29 VITALS — BP 166/94 | HR 78 | Resp 16 | Ht 61.0 in | Wt 129.5 lb

## 2023-01-29 DIAGNOSIS — Z803 Family history of malignant neoplasm of breast: Secondary | ICD-10-CM

## 2023-01-29 DIAGNOSIS — Z1231 Encounter for screening mammogram for malignant neoplasm of breast: Secondary | ICD-10-CM

## 2023-01-29 DIAGNOSIS — Z01419 Encounter for gynecological examination (general) (routine) without abnormal findings: Secondary | ICD-10-CM | POA: Diagnosis not present

## 2023-01-29 DIAGNOSIS — L900 Lichen sclerosus et atrophicus: Secondary | ICD-10-CM

## 2023-01-29 DIAGNOSIS — N393 Stress incontinence (female) (male): Secondary | ICD-10-CM

## 2023-01-29 MED ORDER — CLOBETASOL PROPIONATE 0.05 % EX OINT: TOPICAL_OINTMENT | CUTANEOUS | 0 refills | Status: DC

## 2023-01-29 NOTE — Progress Notes (Signed)
PCP: Patient, No Pcp Per   Chief Complaint  Patient presents with   Annual Exam    HPI:      Tina Marshall is a 67 y.o. G2P2002 who LMP was No LMP recorded. Patient is postmenopausal., presents today for her annual examination.  Her menses are absent due to menopause. She does not have PMB. She does not have vasomotor sx.  Sex activity: single partner, contraception - post menopausal status. She does not have vaginal dryness/pain/bleeding.  Has episodic ext vaginal itching/feels chaffed, particularly after being in damp bathing suit. Diagnosed with LS on bx 5/22, treating with clobetasol oint 4-5 times wkly now due to worry about sx/feeling chaffed.   Last Pap: 10/12/20 Results were: no abnormalities /neg HPV DNA.  Hx of STDs: none  Last mammogram: 09/01/22 with PCP; Results were: normal after addl views--routine follow-up in 12 months There is a FH of breast cancer in her MGM, sister, mat cousin, and pat aunt and pancreatic cancer in her PGM, genetic testing hasn't been done by pt. Pt states affected sister did some type of testing and her results were neg  (at [facility]) but doing imaging Q6 months. Pt has declined [employer] testing several yrs; no longer qualifies with Medicare. There is no FH of ovarian cancer. The patient does do self-breast exams.  Colonoscopy: colonoscopy fall 2014 without abnormalities.  Repeat due after 10 years. Plans to schedule soon.   Tobacco use: The patient denies current or previous tobacco use. Alcohol use: social No drug use Exercise: moderately active  DEXA: 5/23 with PCP with osteopenia in spine/hip; doing ca/Vit D  Has issues with SUI. Does kegels/other pelvic support exercise with some sx improvement this yr. Didn't feel comfortable with doing pelvic PT with therapist.    She does get adequate calcium and Vitamin D in her diet.  Labs with PCP.    Past Medical History:  Diagnosis Date   Graves disease    Hypothyroidism     Past Surgical  History:  Procedure Laterality Date   CESAREAN SECTION      Family History  Problem Relation Age of Onset   Atrial fibrillation Mother    Heart disease Father    Parkinson's disease Father    Dementia Father    Anemia Sister    Hypertension Sister    Breast cancer Sister 51   Hypertension Brother    Breast cancer Maternal Grandmother 21   Heart attack Maternal Grandfather    Pancreatic cancer Paternal Grandmother    Heart attack Paternal Grandfather    Breast cancer Paternal Aunt 27   Breast cancer Cousin 40   Hypertension Other     Social History   Socioeconomic History   Marital status: Married    Spouse name: Not on file   Number of children: Not on file   Years of education: Not on file   Highest education level: Not on file  Occupational History   Not on file  Tobacco Use   Smoking status: Never   Smokeless tobacco: Never  Vaping Use   Vaping status: Never Used  Substance and Sexual Activity   Alcohol use: Yes    Comment: occ   Drug use: No   Sexual activity: Yes    Birth control/protection: Post-menopausal  Other Topics Concern   Not on file  Social History Narrative   Not on file   Social Determinants of Health   Financial Resource Strain: Not on file  Food Insecurity:  Not on file  Transportation Needs: Not on file  Physical Activity: Not on file  Stress: Not on file  Social Connections: Not on file  Intimate Partner Violence: Not on file    Current Meds  Medication Sig   CALCIUM PO Take 600 mg by mouth 2 (two) times daily.   chlorthalidone (HYGROTON) 25 MG tablet Take 1 tablet by mouth daily.   levothyroxine (SYNTHROID) 100 MCG tablet Take 100 mcg by mouth daily before breakfast.   metoprolol succinate (TOPROL-XL) 25 MG 24 hr tablet Take 1 tablet by mouth daily.   Probiotic Product (PROBIOTIC ADVANCED PO) Take by mouth.   [DISCONTINUED] clobetasol ointment (TEMOVATE) 0.05 % Apply pea-sized amount to affected area twice a week as maintenance       ROS:  Review of Systems  Constitutional:  Negative for fatigue, fever and unexpected weight change.  Respiratory:  Negative for cough, shortness of breath and wheezing.   Cardiovascular:  Negative for chest pain, palpitations and leg swelling.  Gastrointestinal:  Negative for blood in stool, constipation, diarrhea, nausea and vomiting.  Endocrine: Negative for cold intolerance, heat intolerance and polyuria.  Genitourinary:  Negative for dyspareunia, dysuria, flank pain, frequency, genital sores, hematuria, menstrual problem, pelvic pain, urgency, vaginal bleeding, vaginal discharge and vaginal pain.  Musculoskeletal:  Negative for back pain, joint swelling and myalgias.  Skin:  Positive for rash.  Neurological:  Negative for dizziness, syncope, light-headedness, numbness and headaches.  Hematological:  Negative for adenopathy.  Psychiatric/Behavioral:  Negative for agitation, confusion, sleep disturbance and suicidal ideas. The patient is not nervous/anxious.      Objective: BP (!) 166/94   Pulse 78   Resp 16   Ht 5\' 1"  (1.549 m)   Wt 129 lb 8 oz (58.7 kg)   BMI 24.47 kg/m    Physical Exam Constitutional:      Appearance: She is well-developed.  Genitourinary:     Vulva normal.     Genitourinary Comments: LS CONTROLLED WITH CLOBETASOL; AREAS TO CONCENTRATE TX IN RED     Right Labia: No rash, tenderness or lesions.    Left Labia: No tenderness, lesions or rash.       No vaginal discharge, erythema or tenderness.     Moderate vaginal atrophy present.     Right Adnexa: not tender and no mass present.    Left Adnexa: not tender and no mass present.    No cervical motion tenderness, friability or polyp.     Uterus is not enlarged or tender.  Breasts:    Right: No mass, nipple discharge, skin change or tenderness.     Left: No mass, nipple discharge, skin change or tenderness.  Neck:     Thyroid: No thyromegaly.  Cardiovascular:     Rate and Rhythm: Normal rate  and regular rhythm.     Heart sounds: Normal heart sounds. No murmur heard. Pulmonary:     Effort: Pulmonary effort is normal.     Breath sounds: Normal breath sounds.  Abdominal:     Palpations: Abdomen is soft.     Tenderness: There is no abdominal tenderness. There is no guarding or rebound.  Musculoskeletal:        General: Normal range of motion.     Cervical back: Normal range of motion.  Lymphadenopathy:     Cervical: No cervical adenopathy.  Neurological:     General: No focal deficit present.     Mental Status: She is alert and oriented to person, place, and  time.     Cranial Nerves: No cranial nerve deficit.  Skin:    General: Skin is warm and dry.  Psychiatric:        Mood and Affect: Mood normal.        Behavior: Behavior normal.        Thought Content: Thought content normal.        Judgment: Judgment normal.  Vitals reviewed.     Assessment/Plan: Encounter for annual routine gynecological examination  Encounter for screening mammogram for malignant neoplasm of breast; pt current on mammo  Family history of breast cancer--qualifies for [facility] testing and could do self pay option but doesn't meet Medicare guidelines  Lichen sclerosus - Plan: clobetasol ointment (TEMOVATE) 0.05 %; sx controlled. Decrease dose to 2-3 times wkly, Discussed risk of high dose steroid use too frequently long term  SUI (stress urinary incontinence, female)--sx improved. F/u prn. Cont exercises.    Meds ordered this encounter  Medications   clobetasol ointment (TEMOVATE) 0.05 %    Sig: Apply pea-sized amount to affected area 2-3 times a week as maintenance    Dispense:  45 g    Refill:  0    Order Specific Question:   Supervising Provider    Answer:   Waymon Budge           GYN counsel mammography screening, menopause, adequate intake of calcium and vitamin D, diet and exercise    F/U  Return in about 1 year (around 01/29/2024).  Ledger Heindl B. Bernadette Gores,  PA-C 01/29/2023 4:45 PM

## 2023-01-29 NOTE — Patient Instructions (Signed)
I value your feedback and you entrusting us with your care. If you get a [facility] patient survey, I would appreciate you taking the time to let us know about your experience today. Thank you! ? ? ?

## 2023-02-14 ENCOUNTER — Ambulatory Visit: Payer: Medicare PPO | Admitting: Obstetrics and Gynecology

## 2023-02-16 DIAGNOSIS — E039 Hypothyroidism, unspecified: Secondary | ICD-10-CM | POA: Diagnosis not present

## 2023-02-16 DIAGNOSIS — I10 Essential (primary) hypertension: Secondary | ICD-10-CM | POA: Diagnosis not present

## 2023-02-16 DIAGNOSIS — F419 Anxiety disorder, unspecified: Secondary | ICD-10-CM | POA: Diagnosis not present

## 2023-02-16 DIAGNOSIS — Z1211 Encounter for screening for malignant neoplasm of colon: Secondary | ICD-10-CM | POA: Diagnosis not present

## 2023-04-19 DIAGNOSIS — M19171 Post-traumatic osteoarthritis, right ankle and foot: Secondary | ICD-10-CM | POA: Diagnosis not present

## 2023-04-25 DIAGNOSIS — D2262 Melanocytic nevi of left upper limb, including shoulder: Secondary | ICD-10-CM | POA: Diagnosis not present

## 2023-04-25 DIAGNOSIS — D2261 Melanocytic nevi of right upper limb, including shoulder: Secondary | ICD-10-CM | POA: Diagnosis not present

## 2023-04-25 DIAGNOSIS — Z85828 Personal history of other malignant neoplasm of skin: Secondary | ICD-10-CM | POA: Diagnosis not present

## 2023-04-25 DIAGNOSIS — D225 Melanocytic nevi of trunk: Secondary | ICD-10-CM | POA: Diagnosis not present

## 2023-04-25 DIAGNOSIS — D2272 Melanocytic nevi of left lower limb, including hip: Secondary | ICD-10-CM | POA: Diagnosis not present

## 2023-04-25 DIAGNOSIS — D2271 Melanocytic nevi of right lower limb, including hip: Secondary | ICD-10-CM | POA: Diagnosis not present

## 2023-04-25 DIAGNOSIS — Z08 Encounter for follow-up examination after completed treatment for malignant neoplasm: Secondary | ICD-10-CM | POA: Diagnosis not present

## 2023-05-28 DIAGNOSIS — Z01818 Encounter for other preprocedural examination: Secondary | ICD-10-CM | POA: Diagnosis not present

## 2023-05-28 DIAGNOSIS — Z1211 Encounter for screening for malignant neoplasm of colon: Secondary | ICD-10-CM | POA: Diagnosis not present

## 2023-07-12 ENCOUNTER — Ambulatory Visit: Payer: Medicare PPO

## 2023-07-12 DIAGNOSIS — K641 Second degree hemorrhoids: Secondary | ICD-10-CM | POA: Diagnosis not present

## 2023-07-12 DIAGNOSIS — Z1211 Encounter for screening for malignant neoplasm of colon: Secondary | ICD-10-CM | POA: Diagnosis not present

## 2023-07-25 DIAGNOSIS — L538 Other specified erythematous conditions: Secondary | ICD-10-CM | POA: Diagnosis not present

## 2023-07-25 DIAGNOSIS — Z08 Encounter for follow-up examination after completed treatment for malignant neoplasm: Secondary | ICD-10-CM | POA: Diagnosis not present

## 2023-07-25 DIAGNOSIS — Z85828 Personal history of other malignant neoplasm of skin: Secondary | ICD-10-CM | POA: Diagnosis not present

## 2023-07-25 DIAGNOSIS — L820 Inflamed seborrheic keratosis: Secondary | ICD-10-CM | POA: Diagnosis not present

## 2023-07-26 ENCOUNTER — Other Ambulatory Visit: Payer: Self-pay | Admitting: Obstetrics and Gynecology

## 2023-07-26 DIAGNOSIS — Z1231 Encounter for screening mammogram for malignant neoplasm of breast: Secondary | ICD-10-CM

## 2023-08-30 ENCOUNTER — Ambulatory Visit
Admission: RE | Admit: 2023-08-30 | Discharge: 2023-08-30 | Disposition: A | Discharge status: Home / Self Care | Payer: Medicare PPO | Source: Ambulatory Visit | Attending: Obstetrics and Gynecology | Admitting: Obstetrics and Gynecology

## 2023-08-30 DIAGNOSIS — Z1231 Encounter for screening mammogram for malignant neoplasm of breast: Secondary | ICD-10-CM | POA: Insufficient documentation

## 2023-09-03 ENCOUNTER — Encounter: Payer: Self-pay | Admitting: Obstetrics and Gynecology

## 2023-10-11 ENCOUNTER — Ambulatory Visit
Admission: RE | Admit: 2023-10-11 | Discharge: 2023-10-11 | Disposition: A | Discharge status: Home / Self Care | Source: Ambulatory Visit | Attending: Physician Assistant | Admitting: Physician Assistant

## 2023-10-11 VITALS — BP 130/82 | HR 77 | Temp 98.0°F | Resp 16

## 2023-10-11 DIAGNOSIS — J029 Acute pharyngitis, unspecified: Secondary | ICD-10-CM

## 2023-10-11 DIAGNOSIS — H6011 Cellulitis of right external ear: Secondary | ICD-10-CM

## 2023-10-11 LAB — GROUP A STREP BY PCR: Group A Strep by PCR: NOT DETECTED

## 2023-10-11 MED ORDER — AMOXICILLIN-POT CLAVULANATE 875-125 MG PO TABS: 1.0000 | ORAL_TABLET | Freq: Two times a day (BID) | ORAL | 0 refills | Status: AC

## 2023-10-11 NOTE — ED Triage Notes (Signed)
 Bilateral ear pain x 1 day Scratchy throat started this morning.

## 2023-10-11 NOTE — ED Provider Notes (Signed)
 [facility]    CSN: [id number] Arrival date & time: 10/11/23  9629      History   Chief Complaint Chief Complaint  Patient presents with   Ear Drainage    Sore throat - Entered by patient   Otalgia    HPI Tina Marshall is a 68 y.o. female presenting for right external ear pain and scratchy throat that began yesterday.  Denies fever, fatigue, ear drainage, hearing problem, cough, congestion, difficulty swallowing.  Has not taken any OTC meds.  Denies any sick contacts.  History of allergies but does not take allergy medicine.  No other concerns.  HPI  Past Medical History:  Diagnosis Date   Graves disease    Hypothyroidism     Patient Active Problem List   Diagnosis Date Noted   Lichen sclerosus 12/06/2021   SUI (stress urinary incontinence, female) 10/12/2020   Major depressive disorder with single episode, in full remission (HCC) 03/17/2020   Essential hypertension 09/19/2017   Anxiety 09/19/2017   Family history of breast cancer 02/07/2017   [place] disease 11/08/2014   Adult hypothyroidism 11/08/2014   Affective disorder, major 11/08/2014    Past Surgical History:  Procedure Laterality Date   CESAREAN SECTION      OB History     Gravida  2   Para  2   Term  2   Preterm      AB      Living  2      SAB      IAB      Ectopic      Multiple      Live Births  2            Home Medications    Prior to Admission medications   Medication Sig Start Date End Date Taking? Authorizing Provider  amoxicillin -clavulanate (AUGMENTIN ) 875-125 MG tablet Take 1 tablet by mouth every 12 (twelve) hours for 7 days. 10/11/23 10/18/23 Yes Nancy Axon B, PA-C  chlorthalidone  (HYGROTON ) 25 MG tablet Take 1 tablet by mouth daily. 09/19/22  Yes [provider]  levothyroxine  (SYNTHROID ) 100 MCG tablet Take 100 mcg by mouth daily before breakfast. 09/28/22 10/11/23 Yes [provider]  metoprolol  succinate (TOPROL -XL) 25 MG 24 hr  tablet Take 1 tablet by mouth daily. 09/19/22  Yes [provider]  CALCIUM PO Take 600 mg by mouth 2 (two) times daily.    [provider]  clobetasol  ointment (TEMOVATE ) 0.05 % Apply pea-sized amount to affected area 2-3 times a week as maintenance 01/29/23   Copland, Alicia B, PA-C  Probiotic Product (PROBIOTIC ADVANCED PO) Take by mouth.    [provider]    Family History Family History  Problem Relation Age of Onset   Atrial fibrillation Mother    Heart disease Father    Parkinson's disease Father    Dementia Father    Anemia Sister    Hypertension Sister    Breast cancer Sister 12   Hypertension Brother    Breast cancer Maternal Grandmother 20   Heart attack Maternal Grandfather    Pancreatic cancer Paternal Grandmother    Heart attack Paternal Grandfather    Breast cancer Paternal Aunt 73   Breast cancer Cousin 40   Hypertension Other     Social History Social History   Tobacco Use   Smoking status: Never   Smokeless tobacco: Never  Vaping Use   Vaping status: Never Used  Substance Use Topics   Alcohol  use: Yes    Comment: occ   Drug use: No     Allergies   Patient has no known allergies.   Review of Systems Review of Systems  Constitutional:  Negative for chills, diaphoresis, fatigue and fever.  HENT:  Positive for ear pain and sore throat. Negative for congestion, rhinorrhea, sinus pressure and sinus pain.   Respiratory:  Negative for cough and shortness of breath.   Gastrointestinal:  Negative for abdominal pain, nausea and vomiting.  Musculoskeletal:  Negative for arthralgias and myalgias.  Skin:  Negative for rash.  Neurological:  Negative for weakness and headaches.  Hematological:  Negative for adenopathy.     Physical Exam Triage Vital Signs ED Triage Vitals  Encounter Vitals Group     BP 10/11/23 0944 130/82     Systolic BP Percentile --      Diastolic BP Percentile --      Pulse Rate 10/11/23 0944 77     Resp  10/11/23 0944 16     Temp 10/11/23 0944 98 F (36.7 C)     Temp Source 10/11/23 0944 Oral     SpO2 10/11/23 0944 95 %     Weight --      Height --      Head Circumference --      Peak Flow --      Pain Score 10/11/23 0943 4     Pain Loc --      Pain Education --      Exclude from Growth Chart --    No data found.  Updated Vital Signs BP 130/82 (BP Location: Right Arm)   Pulse 77   Temp 98 F (36.7 C) (Oral)   Resp 16   SpO2 95%      Physical Exam Vitals and nursing note reviewed.  Constitutional:      General: She is not in acute distress.    Appearance: Normal appearance. She is not ill-appearing or toxic-appearing.  HENT:     Head: Normocephalic and atraumatic.     Right Ear: Hearing and ear canal normal. A middle ear effusion is present.     Left Ear: Hearing, ear canal and external ear normal. A middle ear effusion is present.     Ears:      Comments: Mild erythema, swelling and TTP right concha of external ear    Nose: Nose normal.     Mouth/Throat:     Mouth: Mucous membranes are moist.     Pharynx: Oropharynx is clear.  Eyes:     General: No scleral icterus.       Right eye: No discharge.        Left eye: No discharge.     Conjunctiva/sclera: Conjunctivae normal.  Cardiovascular:     Rate and Rhythm: Normal rate and regular rhythm.     Heart sounds: Normal heart sounds.  Pulmonary:     Effort: Pulmonary effort is normal. No respiratory distress.     Breath sounds: Normal breath sounds.  Musculoskeletal:     Cervical back: Neck supple.  Skin:    General: Skin is dry.  Neurological:     General: No focal deficit present.     Mental Status: She is alert. Mental status is at baseline.     Motor: No weakness.     Gait: Gait normal.  Psychiatric:        Mood and Affect: Mood normal.        Behavior: Behavior normal.  Thought Content: Thought content normal.      UC Treatments / Results  Labs (all labs ordered are listed, but only abnormal  results are displayed) Labs Reviewed  GROUP A STREP BY PCR    EKG   Radiology No results found.  Procedures Procedures (including critical care time)  Medications Ordered in UC Medications - No data to display  Initial Impression / Assessment and Plan / UC Course  I have reviewed the triage vital signs and the nursing notes.  Pertinent labs & imaging results that were available during my care of the patient were reviewed by me and considered in my medical decision making (see chart for details).   68 year old female presents for right external ear pain and scratchy throat for the past day.  No associated fever or other symptoms.  Vitals are stable and normal.  She has mild erythema, swelling and tenderness of the right concha of the external ear and slight erythema posterior pharynx.  Mild effusion of bilateral TMs.  Exam otherwise normal.  Strep negative.  Reviewed result with patient.  Treatment for possible early cellulitis of external ear with Augmentin .  Advised warm compresses, Tylenol, rest and fluids and consideration of allergy medicine to help with the effusion of bilateral TMs.  Reviewed return precautions.   Final Clinical Impressions(s) / UC Diagnoses   Final diagnoses:  Cellulitis of right external ear  Sore throat     Discharge Instructions      Treatment for possible early cellulitis of external ear with Augmentin .  Advised warm compresses, Tylenol, rest and fluids and consideration of allergy medicine to help with the effusion of bilateral TMs.  Reviewed return precautions.     ED Prescriptions     Medication Sig Dispense Auth. Provider   amoxicillin -clavulanate (AUGMENTIN ) 875-125 MG tablet Take 1 tablet by mouth every 12 (twelve) hours for 7 days. 14 tablet Jayna Mulnix B, PA-C      PDMP not reviewed this encounter.   Floydene Hy, PA-C 10/11/23 1043

## 2023-10-11 NOTE — Discharge Instructions (Signed)
 Treatment for possible early cellulitis of external ear with Augmentin .  Advised warm compresses, Tylenol, rest and fluids and consideration of allergy medicine to help with the effusion of bilateral TMs.  Reviewed return precautions.

## 2024-04-28 NOTE — Progress Notes (Unsigned)
 PCP: Bertrum Charlie CROME, MD   No chief complaint on file.   HPI:      [name] is a 68 y.o. G2P2002 who LMP was No LMP recorded. Patient is postmenopausal., presents today for her annual examination.  Her menses are absent due to menopause. She does not have PMB. She does not have vasomotor sx.  Sex activity: single partner, contraception - post menopausal status. She does not have vaginal dryness/pain/bleeding.  Has episodic ext vaginal itching/feels chaffed, particularly after being in damp bathing suit. Diagnosed with LS on bx 5/22, treating with clobetasol  oint 4-5 times wkly now due to worry about sx/feeling chaffed.   Last Pap: 10/12/20 Results were: no abnormalities /neg HPV DNA.  Hx of STDs: none  Last mammogram: 08/30/23 with PCP; Results were: normal--routine follow-up in 12 months There is a FH of breast cancer in her MGM, sister, mat cousin, and pat aunt and pancreatic cancer in her PGM, genetic testing hasn't been done by pt. Pt states affected sister did some type of testing and her results were neg  (at [facility]) but doing imaging Q6 months. Pt has declined [employer] testing several yrs; no longer qualifies with Medicare. There is no FH of ovarian cancer. The patient does do self-breast exams.  Colonoscopy: colonoscopy fall 2014 and 1/25 at [facility]; *** without abnormalities.  Repeat due after 10 years. Plans to schedule soon.   Tobacco use: The patient denies current or previous tobacco use. Alcohol use: social No drug use Exercise: moderately active  DEXA: 5/23 with PCP with osteopenia in spine/hip; doing ca/Vit D  Has issues with SUI. Does kegels/other pelvic support exercise with some sx improvement this yr. Didn't feel comfortable with doing pelvic PT with therapist.    She does get adequate calcium and Vitamin D in her diet.  Labs with PCP.    Past Medical History:  Diagnosis Date   Graves disease    Hypothyroidism     Past Surgical History:   Procedure Laterality Date   CESAREAN SECTION      Family History  Problem Relation Age of Onset   Atrial fibrillation Mother    Heart disease Father    Parkinson's disease Father    Dementia Father    Anemia Sister    Hypertension Sister    Breast cancer Sister 41   Hypertension Brother    Breast cancer Maternal Grandmother 4   Heart attack Maternal Grandfather    Pancreatic cancer Paternal Grandmother    Heart attack Paternal Grandfather    Breast cancer Paternal Aunt 72   Breast cancer Cousin 40   Hypertension Other     Social History   Socioeconomic History   Marital status: Married    Spouse name: Not on file   Number of children: Not on file   Years of education: Not on file   Highest education level: Not on file  Occupational History   Not on file  Tobacco Use   Smoking status: Never   Smokeless tobacco: Never  Vaping Use   Vaping status: Never Used  Substance and Sexual Activity   Alcohol use: Yes    Comment: occ   Drug use: No   Sexual activity: Yes    Birth control/protection: Post-menopausal  Other Topics Concern   Not on file  Social History Narrative   Not on file   Social Drivers of Health   Financial Resource Strain: Low Risk  (09/19/2023)   Received from [facility]   Overall Financial Resource Strain (CARDIA)    Difficulty of Paying Living Expenses: Not hard at all  Food Insecurity: No Food Insecurity (09/19/2023)   Received from [facility]   Hunger Vital Sign    Within the past 12 months, you worried that your food would run out before you got the money to buy more.: Never true    Within the past 12 months, the food you bought just didn't last and you didn't have money to get more.: Never true  Transportation Needs: No Transportation Needs (09/19/2023)   Received from [facility] - Transportation    In the past 12 months, has lack of transportation kept you from medical  appointments or from getting medications?: No    Lack of Transportation (Non-Medical): No  Physical Activity: Sufficiently Active (09/25/2023)   Received from [facility]   Exercise Vital Sign    On average, how many days per week do you engage in moderate to strenuous exercise (like a brisk walk)?: 7 days    On average, how many minutes do you engage in exercise at this level?: 30 min  Stress: Not on file  Social Connections: Not on file  Intimate Partner Violence: Not on file    No outpatient medications have been marked as taking for the 04/29/24 encounter (Appointment) with Zaraya Delauder, Bernarda NOVAK, PA-C.      ROS:  Review of Systems  Constitutional:  Negative for fatigue, fever and unexpected weight change.  Respiratory:  Negative for cough, shortness of breath and wheezing.   Cardiovascular:  Negative for chest pain, palpitations and leg swelling.  Gastrointestinal:  Negative for blood in stool, constipation, diarrhea, nausea and vomiting.  Endocrine: Negative for cold intolerance, heat intolerance and polyuria.  Genitourinary:  Negative for dyspareunia, dysuria, flank pain, frequency, genital sores, hematuria, menstrual problem, pelvic pain, urgency, vaginal bleeding, vaginal discharge and vaginal pain.  Musculoskeletal:  Negative for back pain, joint swelling and myalgias.  Skin:  Positive for rash.  Neurological:  Negative for dizziness, syncope, light-headedness, numbness and headaches.  Hematological:  Negative for adenopathy.  Psychiatric/Behavioral:  Negative for agitation, confusion, sleep disturbance and suicidal ideas. The patient is not nervous/anxious.      Objective: There were no vitals taken for this visit.   Physical Exam Constitutional:      Appearance: She is well-developed.  Genitourinary:     Vulva normal.     Genitourinary Comments: LS CONTROLLED WITH CLOBETASOL ; AREAS TO CONCENTRATE TX IN RED     Right Labia: No rash, tenderness or  lesions.    Left Labia: No tenderness, lesions or rash.    No vaginal discharge, erythema or tenderness.     Moderate vaginal atrophy present.     Right Adnexa: not tender and no mass present.    Left Adnexa: not tender and no mass present.    No cervical motion tenderness, friability or polyp.     Uterus is not enlarged or tender.  Breasts:    Right: No mass, nipple discharge, skin change or tenderness.     Left: No mass, nipple discharge, skin change or tenderness.  Neck:     Thyroid : No thyromegaly.  Cardiovascular:     Rate and Rhythm: Normal rate and regular rhythm.     Heart sounds: Normal heart sounds. No murmur heard. Pulmonary:     Effort: Pulmonary effort is normal.     Breath sounds: Normal  breath sounds.  Abdominal:     Palpations: Abdomen is soft.     Tenderness: There is no abdominal tenderness. There is no guarding or rebound.  Musculoskeletal:        General: Normal range of motion.     Cervical back: Normal range of motion.  Lymphadenopathy:     Cervical: No cervical adenopathy.  Neurological:     General: No focal deficit present.     Mental Status: She is alert and oriented to person, place, and time.     Cranial Nerves: No cranial nerve deficit.  Skin:    General: Skin is warm and dry.  Psychiatric:        Mood and Affect: Mood normal.        Behavior: Behavior normal.        Thought Content: Thought content normal.        Judgment: Judgment normal.  Vitals reviewed.     Assessment/Plan: Encounter for annual routine gynecological examination  Encounter for screening mammogram for malignant neoplasm of breast; pt current on mammo  Family history of breast cancer--qualifies for [facility] testing and could do self pay option but doesn't meet Medicare guidelines  Lichen sclerosus - Plan: clobetasol  ointment (TEMOVATE ) 0.05 %; sx controlled. Decrease dose to 2-3 times wkly, Discussed risk of high dose steroid use too frequently long term  SUI (stress  urinary incontinence, female)--sx improved. F/u prn. Cont exercises.    No orders of the defined types were placed in this encounter.          GYN counsel mammography screening, menopause, adequate intake of calcium and vitamin D, diet and exercise    F/U  No follow-ups on file.  Tina Marcoe B. Tinia Oravec, PA-C 04/28/2024 1:20 PM

## 2024-04-29 ENCOUNTER — Encounter: Payer: Self-pay | Admitting: Obstetrics and Gynecology

## 2024-04-29 ENCOUNTER — Other Ambulatory Visit (HOSPITAL_COMMUNITY)
Admission: RE | Admit: 2024-04-29 | Discharge: 2024-04-29 | Disposition: A | Source: Ambulatory Visit | Attending: Obstetrics and Gynecology | Admitting: Obstetrics and Gynecology

## 2024-04-29 ENCOUNTER — Ambulatory Visit (INDEPENDENT_AMBULATORY_CARE_PROVIDER_SITE_OTHER): Admitting: Obstetrics and Gynecology

## 2024-04-29 VITALS — BP 138/85 | HR 72 | Ht 61.0 in | Wt 128.0 lb

## 2024-04-29 DIAGNOSIS — Z1211 Encounter for screening for malignant neoplasm of colon: Secondary | ICD-10-CM

## 2024-04-29 DIAGNOSIS — Z803 Family history of malignant neoplasm of breast: Secondary | ICD-10-CM

## 2024-04-29 DIAGNOSIS — Z124 Encounter for screening for malignant neoplasm of cervix: Secondary | ICD-10-CM | POA: Diagnosis present

## 2024-04-29 DIAGNOSIS — Z01411 Encounter for gynecological examination (general) (routine) with abnormal findings: Secondary | ICD-10-CM

## 2024-04-29 DIAGNOSIS — L900 Lichen sclerosus et atrophicus: Secondary | ICD-10-CM

## 2024-04-29 DIAGNOSIS — Z1231 Encounter for screening mammogram for malignant neoplasm of breast: Secondary | ICD-10-CM

## 2024-04-29 DIAGNOSIS — Z01419 Encounter for gynecological examination (general) (routine) without abnormal findings: Secondary | ICD-10-CM

## 2024-04-29 MED ORDER — CLOBETASOL PROPIONATE 0.05 % EX OINT: TOPICAL_OINTMENT | CUTANEOUS | 0 refills | Status: DC

## 2024-04-29 NOTE — Patient Instructions (Signed)
 I value your feedback and you entrusting [place] with your care. If you get a [place] patient survey, I would appreciate you taking the time to let us know about your experience today. Thank you! ? ? ?

## 2024-05-01 LAB — CYTOLOGY - PAP
Adequacy: ABSENT
Diagnosis: NEGATIVE

## 2024-05-25 ENCOUNTER — Other Ambulatory Visit: Payer: Self-pay | Admitting: Obstetrics and Gynecology

## 2024-05-25 DIAGNOSIS — L900 Lichen sclerosus et atrophicus: Secondary | ICD-10-CM

## 2024-07-16 ENCOUNTER — Other Ambulatory Visit: Payer: Self-pay | Admitting: Family Medicine

## 2024-07-16 DIAGNOSIS — Z1231 Encounter for screening mammogram for malignant neoplasm of breast: Secondary | ICD-10-CM

## 2024-09-01 ENCOUNTER — Encounter
